# Patient Record
Sex: Female | Born: 1955 | Race: White | Hispanic: No | Marital: Married | Smoking: Former smoker
Health system: Southern US, Community
[De-identification: ages and names within clinical notes are randomized; demographics above are authoritative.]

## PROBLEM LIST (undated history)

## (undated) DIAGNOSIS — F32A Depression, unspecified: Secondary | ICD-10-CM

## (undated) DIAGNOSIS — E785 Hyperlipidemia, unspecified: Secondary | ICD-10-CM

## (undated) DIAGNOSIS — F419 Anxiety disorder, unspecified: Secondary | ICD-10-CM

## (undated) DIAGNOSIS — I10 Essential (primary) hypertension: Secondary | ICD-10-CM

## (undated) DIAGNOSIS — G4730 Sleep apnea, unspecified: Secondary | ICD-10-CM

## (undated) DIAGNOSIS — F329 Major depressive disorder, single episode, unspecified: Secondary | ICD-10-CM

## (undated) HISTORY — DX: Hyperlipidemia, unspecified: E78.5

## (undated) HISTORY — PX: BREAST EXCISIONAL BIOPSY: SUR124

## (undated) HISTORY — DX: Sleep apnea, unspecified: G47.30

---

## 1999-01-31 ENCOUNTER — Other Ambulatory Visit: Admission: RE | Admit: 1999-01-31 | Discharge: 1999-01-31 | Payer: Self-pay | Admitting: Obstetrics and Gynecology

## 1999-11-07 ENCOUNTER — Encounter: Payer: Self-pay | Admitting: Obstetrics and Gynecology

## 1999-11-07 ENCOUNTER — Encounter: Admission: RE | Admit: 1999-11-07 | Discharge: 1999-11-07 | Payer: Self-pay | Admitting: Obstetrics and Gynecology

## 2000-02-03 ENCOUNTER — Other Ambulatory Visit: Admission: RE | Admit: 2000-02-03 | Discharge: 2000-02-03 | Payer: Self-pay | Admitting: Obstetrics and Gynecology

## 2001-03-11 ENCOUNTER — Other Ambulatory Visit: Admission: RE | Admit: 2001-03-11 | Discharge: 2001-03-11 | Payer: Self-pay | Admitting: Obstetrics and Gynecology

## 2001-03-24 ENCOUNTER — Encounter: Admission: RE | Admit: 2001-03-24 | Discharge: 2001-03-24 | Payer: Self-pay | Admitting: Obstetrics and Gynecology

## 2001-03-24 ENCOUNTER — Encounter: Payer: Self-pay | Admitting: Obstetrics and Gynecology

## 2002-03-22 ENCOUNTER — Other Ambulatory Visit: Admission: RE | Admit: 2002-03-22 | Discharge: 2002-03-22 | Payer: Self-pay | Admitting: Obstetrics and Gynecology

## 2002-03-27 ENCOUNTER — Encounter: Admission: RE | Admit: 2002-03-27 | Discharge: 2002-03-27 | Payer: Self-pay | Admitting: Obstetrics and Gynecology

## 2002-03-27 ENCOUNTER — Encounter: Payer: Self-pay | Admitting: Obstetrics and Gynecology

## 2003-04-02 ENCOUNTER — Encounter: Admission: RE | Admit: 2003-04-02 | Discharge: 2003-04-02 | Payer: Self-pay | Admitting: Obstetrics and Gynecology

## 2003-04-02 ENCOUNTER — Encounter: Payer: Self-pay | Admitting: Obstetrics and Gynecology

## 2003-04-13 ENCOUNTER — Other Ambulatory Visit: Admission: RE | Admit: 2003-04-13 | Discharge: 2003-04-13 | Payer: Self-pay | Admitting: Obstetrics and Gynecology

## 2004-05-14 ENCOUNTER — Other Ambulatory Visit: Admission: RE | Admit: 2004-05-14 | Discharge: 2004-05-14 | Payer: Self-pay | Admitting: Obstetrics and Gynecology

## 2005-06-25 ENCOUNTER — Other Ambulatory Visit: Admission: RE | Admit: 2005-06-25 | Discharge: 2005-06-25 | Payer: Self-pay | Admitting: Obstetrics and Gynecology

## 2009-09-25 ENCOUNTER — Encounter: Admission: RE | Admit: 2009-09-25 | Discharge: 2009-09-25 | Payer: Self-pay | Admitting: Obstetrics and Gynecology

## 2010-11-05 ENCOUNTER — Other Ambulatory Visit: Payer: Self-pay | Admitting: Obstetrics and Gynecology

## 2010-11-05 DIAGNOSIS — Z1239 Encounter for other screening for malignant neoplasm of breast: Secondary | ICD-10-CM

## 2010-11-24 ENCOUNTER — Ambulatory Visit
Admission: RE | Admit: 2010-11-24 | Discharge: 2010-11-24 | Disposition: A | Discharge status: Home / Self Care | Payer: BC Managed Care – PPO | Source: Ambulatory Visit | Attending: Obstetrics and Gynecology | Admitting: Obstetrics and Gynecology

## 2010-11-24 DIAGNOSIS — Z1239 Encounter for other screening for malignant neoplasm of breast: Secondary | ICD-10-CM

## 2012-03-15 ENCOUNTER — Other Ambulatory Visit: Payer: Self-pay | Admitting: Obstetrics and Gynecology

## 2012-03-15 DIAGNOSIS — Z1231 Encounter for screening mammogram for malignant neoplasm of breast: Secondary | ICD-10-CM

## 2012-03-28 ENCOUNTER — Ambulatory Visit
Admission: RE | Admit: 2012-03-28 | Discharge: 2012-03-28 | Disposition: A | Discharge status: Home / Self Care | Payer: BC Managed Care – PPO | Source: Ambulatory Visit | Attending: Obstetrics and Gynecology | Admitting: Obstetrics and Gynecology

## 2012-03-28 DIAGNOSIS — Z1231 Encounter for screening mammogram for malignant neoplasm of breast: Secondary | ICD-10-CM

## 2012-07-27 ENCOUNTER — Other Ambulatory Visit: Payer: Self-pay | Admitting: Family Medicine

## 2012-07-27 DIAGNOSIS — J320 Chronic maxillary sinusitis: Secondary | ICD-10-CM

## 2012-07-28 ENCOUNTER — Ambulatory Visit
Admission: RE | Admit: 2012-07-28 | Discharge: 2012-07-28 | Disposition: A | Discharge status: Home / Self Care | Payer: BC Managed Care – PPO | Source: Ambulatory Visit | Attending: Family Medicine | Admitting: Family Medicine

## 2012-07-28 DIAGNOSIS — J320 Chronic maxillary sinusitis: Secondary | ICD-10-CM

## 2013-03-29 ENCOUNTER — Other Ambulatory Visit: Payer: Self-pay

## 2013-03-29 DIAGNOSIS — Z1231 Encounter for screening mammogram for malignant neoplasm of breast: Secondary | ICD-10-CM

## 2013-04-06 ENCOUNTER — Ambulatory Visit
Admission: RE | Admit: 2013-04-06 | Discharge: 2013-04-06 | Disposition: A | Discharge status: Home / Self Care | Payer: BC Managed Care – PPO | Source: Ambulatory Visit

## 2013-04-06 DIAGNOSIS — Z1231 Encounter for screening mammogram for malignant neoplasm of breast: Secondary | ICD-10-CM

## 2014-03-14 ENCOUNTER — Other Ambulatory Visit: Payer: Self-pay

## 2014-03-14 DIAGNOSIS — Z1231 Encounter for screening mammogram for malignant neoplasm of breast: Secondary | ICD-10-CM

## 2014-04-09 ENCOUNTER — Ambulatory Visit
Admission: RE | Admit: 2014-04-09 | Discharge: 2014-04-09 | Disposition: A | Discharge status: Home / Self Care | Payer: BC Managed Care – PPO | Source: Ambulatory Visit

## 2014-04-09 ENCOUNTER — Encounter (INDEPENDENT_AMBULATORY_CARE_PROVIDER_SITE_OTHER): Payer: Self-pay

## 2014-04-09 DIAGNOSIS — Z1231 Encounter for screening mammogram for malignant neoplasm of breast: Secondary | ICD-10-CM

## 2015-03-14 ENCOUNTER — Other Ambulatory Visit: Payer: Self-pay

## 2015-03-14 DIAGNOSIS — Z1231 Encounter for screening mammogram for malignant neoplasm of breast: Secondary | ICD-10-CM

## 2015-04-11 ENCOUNTER — Ambulatory Visit
Admission: RE | Admit: 2015-04-11 | Discharge: 2015-04-11 | Disposition: A | Discharge status: Home / Self Care | Payer: 59 | Source: Ambulatory Visit

## 2015-04-11 DIAGNOSIS — Z1231 Encounter for screening mammogram for malignant neoplasm of breast: Secondary | ICD-10-CM

## 2016-03-18 ENCOUNTER — Other Ambulatory Visit: Payer: Self-pay | Admitting: Family Medicine

## 2016-03-18 DIAGNOSIS — Z1231 Encounter for screening mammogram for malignant neoplasm of breast: Secondary | ICD-10-CM

## 2016-04-20 ENCOUNTER — Ambulatory Visit
Admission: RE | Admit: 2016-04-20 | Discharge: 2016-04-20 | Disposition: A | Discharge status: Home / Self Care | Payer: Self-pay | Source: Ambulatory Visit | Attending: Family Medicine | Admitting: Family Medicine

## 2016-04-20 DIAGNOSIS — Z1231 Encounter for screening mammogram for malignant neoplasm of breast: Secondary | ICD-10-CM

## 2016-10-05 DIAGNOSIS — J069 Acute upper respiratory infection, unspecified: Secondary | ICD-10-CM | POA: Diagnosis not present

## 2016-10-05 DIAGNOSIS — A084 Viral intestinal infection, unspecified: Secondary | ICD-10-CM | POA: Diagnosis not present

## 2017-01-05 DIAGNOSIS — Z23 Encounter for immunization: Secondary | ICD-10-CM | POA: Diagnosis not present

## 2017-01-05 DIAGNOSIS — R739 Hyperglycemia, unspecified: Secondary | ICD-10-CM | POA: Diagnosis not present

## 2017-01-05 DIAGNOSIS — G43009 Migraine without aura, not intractable, without status migrainosus: Secondary | ICD-10-CM | POA: Diagnosis not present

## 2017-01-05 DIAGNOSIS — E781 Pure hyperglyceridemia: Secondary | ICD-10-CM | POA: Diagnosis not present

## 2017-01-05 DIAGNOSIS — J309 Allergic rhinitis, unspecified: Secondary | ICD-10-CM | POA: Diagnosis not present

## 2017-02-03 DIAGNOSIS — J321 Chronic frontal sinusitis: Secondary | ICD-10-CM | POA: Diagnosis not present

## 2017-02-09 DIAGNOSIS — Z01419 Encounter for gynecological examination (general) (routine) without abnormal findings: Secondary | ICD-10-CM | POA: Diagnosis not present

## 2017-03-12 ENCOUNTER — Other Ambulatory Visit: Payer: Self-pay | Admitting: Obstetrics and Gynecology

## 2017-03-12 DIAGNOSIS — Z1231 Encounter for screening mammogram for malignant neoplasm of breast: Secondary | ICD-10-CM

## 2017-03-12 DIAGNOSIS — L281 Prurigo nodularis: Secondary | ICD-10-CM | POA: Diagnosis not present

## 2017-03-12 DIAGNOSIS — L821 Other seborrheic keratosis: Secondary | ICD-10-CM | POA: Diagnosis not present

## 2017-03-12 DIAGNOSIS — L738 Other specified follicular disorders: Secondary | ICD-10-CM | POA: Diagnosis not present

## 2017-03-12 DIAGNOSIS — L570 Actinic keratosis: Secondary | ICD-10-CM | POA: Diagnosis not present

## 2017-03-15 DIAGNOSIS — Z13820 Encounter for screening for osteoporosis: Secondary | ICD-10-CM | POA: Diagnosis not present

## 2017-04-21 ENCOUNTER — Ambulatory Visit: Payer: Self-pay

## 2017-04-21 ENCOUNTER — Ambulatory Visit
Admission: RE | Admit: 2017-04-21 | Discharge: 2017-04-21 | Disposition: A | Discharge status: Home / Self Care | Payer: Commercial Managed Care - PPO | Source: Ambulatory Visit | Attending: Obstetrics and Gynecology | Admitting: Obstetrics and Gynecology

## 2017-04-21 DIAGNOSIS — Z1231 Encounter for screening mammogram for malignant neoplasm of breast: Secondary | ICD-10-CM | POA: Diagnosis not present

## 2017-05-14 DIAGNOSIS — Z23 Encounter for immunization: Secondary | ICD-10-CM | POA: Diagnosis not present

## 2017-07-12 DIAGNOSIS — R350 Frequency of micturition: Secondary | ICD-10-CM | POA: Diagnosis not present

## 2017-08-02 DIAGNOSIS — Z23 Encounter for immunization: Secondary | ICD-10-CM | POA: Diagnosis not present

## 2017-08-02 DIAGNOSIS — Z713 Dietary counseling and surveillance: Secondary | ICD-10-CM | POA: Diagnosis not present

## 2017-10-08 DIAGNOSIS — M79672 Pain in left foot: Secondary | ICD-10-CM | POA: Diagnosis not present

## 2017-10-08 DIAGNOSIS — M21612 Bunion of left foot: Secondary | ICD-10-CM | POA: Diagnosis not present

## 2017-10-08 DIAGNOSIS — M2042 Other hammer toe(s) (acquired), left foot: Secondary | ICD-10-CM | POA: Diagnosis not present

## 2017-12-22 DIAGNOSIS — M21612 Bunion of left foot: Secondary | ICD-10-CM | POA: Diagnosis not present

## 2017-12-22 DIAGNOSIS — M2042 Other hammer toe(s) (acquired), left foot: Secondary | ICD-10-CM | POA: Diagnosis not present

## 2017-12-22 DIAGNOSIS — M21619 Bunion of unspecified foot: Secondary | ICD-10-CM | POA: Diagnosis not present

## 2018-01-17 DIAGNOSIS — G43009 Migraine without aura, not intractable, without status migrainosus: Secondary | ICD-10-CM | POA: Diagnosis not present

## 2018-01-18 DIAGNOSIS — Z0189 Encounter for other specified special examinations: Secondary | ICD-10-CM | POA: Diagnosis not present

## 2018-01-18 DIAGNOSIS — Z1159 Encounter for screening for other viral diseases: Secondary | ICD-10-CM | POA: Diagnosis not present

## 2018-01-26 ENCOUNTER — Other Ambulatory Visit: Payer: Self-pay | Admitting: Orthopedic Surgery

## 2018-02-17 DIAGNOSIS — Z01419 Encounter for gynecological examination (general) (routine) without abnormal findings: Secondary | ICD-10-CM | POA: Diagnosis not present

## 2018-02-17 DIAGNOSIS — Z1212 Encounter for screening for malignant neoplasm of rectum: Secondary | ICD-10-CM | POA: Diagnosis not present

## 2018-02-17 DIAGNOSIS — Z6826 Body mass index (BMI) 26.0-26.9, adult: Secondary | ICD-10-CM | POA: Diagnosis not present

## 2018-03-08 ENCOUNTER — Other Ambulatory Visit: Payer: Self-pay

## 2018-03-08 ENCOUNTER — Encounter (HOSPITAL_BASED_OUTPATIENT_CLINIC_OR_DEPARTMENT_OTHER): Payer: Self-pay | Admitting: *Deleted

## 2018-03-11 ENCOUNTER — Other Ambulatory Visit: Payer: Self-pay | Admitting: Family Medicine

## 2018-03-11 DIAGNOSIS — Z1231 Encounter for screening mammogram for malignant neoplasm of breast: Secondary | ICD-10-CM

## 2018-03-17 ENCOUNTER — Other Ambulatory Visit: Payer: Self-pay

## 2018-03-17 ENCOUNTER — Ambulatory Visit (HOSPITAL_BASED_OUTPATIENT_CLINIC_OR_DEPARTMENT_OTHER): Payer: Commercial Managed Care - PPO | Admitting: Certified Registered"

## 2018-03-17 ENCOUNTER — Encounter (HOSPITAL_BASED_OUTPATIENT_CLINIC_OR_DEPARTMENT_OTHER)
Admission: RE | Disposition: A | Discharge status: Home / Self Care | Payer: Self-pay | Source: Ambulatory Visit | Attending: Orthopedic Surgery

## 2018-03-17 ENCOUNTER — Ambulatory Visit (HOSPITAL_BASED_OUTPATIENT_CLINIC_OR_DEPARTMENT_OTHER)
Admission: RE | Admit: 2018-03-17 | Discharge: 2018-03-17 | Disposition: A | Discharge status: Home / Self Care | Payer: Commercial Managed Care - PPO | Source: Ambulatory Visit | Attending: Orthopedic Surgery | Admitting: Orthopedic Surgery

## 2018-03-17 ENCOUNTER — Encounter (HOSPITAL_BASED_OUTPATIENT_CLINIC_OR_DEPARTMENT_OTHER): Payer: Self-pay | Admitting: Certified Registered"

## 2018-03-17 DIAGNOSIS — F329 Major depressive disorder, single episode, unspecified: Secondary | ICD-10-CM | POA: Insufficient documentation

## 2018-03-17 DIAGNOSIS — Z79899 Other long term (current) drug therapy: Secondary | ICD-10-CM | POA: Diagnosis not present

## 2018-03-17 DIAGNOSIS — Z91040 Latex allergy status: Secondary | ICD-10-CM | POA: Insufficient documentation

## 2018-03-17 DIAGNOSIS — I10 Essential (primary) hypertension: Secondary | ICD-10-CM | POA: Diagnosis not present

## 2018-03-17 DIAGNOSIS — Z79890 Hormone replacement therapy (postmenopausal): Secondary | ICD-10-CM | POA: Insufficient documentation

## 2018-03-17 DIAGNOSIS — M21612 Bunion of left foot: Secondary | ICD-10-CM | POA: Insufficient documentation

## 2018-03-17 DIAGNOSIS — F419 Anxiety disorder, unspecified: Secondary | ICD-10-CM | POA: Insufficient documentation

## 2018-03-17 DIAGNOSIS — M2042 Other hammer toe(s) (acquired), left foot: Secondary | ICD-10-CM | POA: Diagnosis not present

## 2018-03-17 DIAGNOSIS — G8918 Other acute postprocedural pain: Secondary | ICD-10-CM | POA: Diagnosis not present

## 2018-03-17 HISTORY — DX: Anxiety disorder, unspecified: F41.9

## 2018-03-17 HISTORY — DX: Depression, unspecified: F32.A

## 2018-03-17 HISTORY — DX: Major depressive disorder, single episode, unspecified: F32.9

## 2018-03-17 HISTORY — DX: Essential (primary) hypertension: I10

## 2018-03-17 HISTORY — PX: BUNIONECTOMY WITH HAMMERTOE RECONSTRUCTION: SHX5600

## 2018-03-17 SURGERY — BUNIONECTOMY, WITH HAMMER TOE CORRECTION
Anesthesia: General | Site: Foot | Laterality: Left

## 2018-03-17 MED ORDER — SENNA 8.6 MG PO TABS: 2.0000 | ORAL_TABLET | Freq: Two times a day (BID) | ORAL | 0 refills | Status: DC

## 2018-03-17 MED ORDER — MIDAZOLAM HCL 2 MG/2ML IJ SOLN
1.0000 mg | INTRAMUSCULAR | Status: DC | PRN
  Administered 2018-03-17: 2 mg via INTRAVENOUS

## 2018-03-17 MED ORDER — ONDANSETRON HCL 4 MG/2ML IJ SOLN
INTRAMUSCULAR | Status: AC
  Filled 2018-03-17: qty 12

## 2018-03-17 MED ORDER — PROPOFOL 10 MG/ML IV BOLUS
INTRAVENOUS | Status: DC | PRN
  Administered 2018-03-17: 150 mg via INTRAVENOUS

## 2018-03-17 MED ORDER — SCOPOLAMINE 1 MG/3DAYS TD PT72: 1.0000 | MEDICATED_PATCH | Freq: Once | TRANSDERMAL | Status: DC | PRN

## 2018-03-17 MED ORDER — BUPIVACAINE-EPINEPHRINE (PF) 0.5% -1:200000 IJ SOLN
INTRAMUSCULAR | Status: AC
  Filled 2018-03-17: qty 30

## 2018-03-17 MED ORDER — FENTANYL CITRATE (PF) 100 MCG/2ML IJ SOLN: 25.0000 ug | INTRAMUSCULAR | Status: DC | PRN

## 2018-03-17 MED ORDER — SCOPOLAMINE 1 MG/3DAYS TD PT72: 1.0000 | MEDICATED_PATCH | TRANSDERMAL | Status: DC

## 2018-03-17 MED ORDER — ONDANSETRON HCL 4 MG/2ML IJ SOLN
INTRAMUSCULAR | Status: DC | PRN
  Administered 2018-03-17: 4 mg via INTRAVENOUS

## 2018-03-17 MED ORDER — FENTANYL CITRATE (PF) 100 MCG/2ML IJ SOLN
50.0000 ug | INTRAMUSCULAR | Status: DC | PRN
  Administered 2018-03-17: 100 ug via INTRAVENOUS

## 2018-03-17 MED ORDER — LACTATED RINGERS IV SOLN
INTRAVENOUS | Status: DC
  Administered 2018-03-17 (×2): via INTRAVENOUS

## 2018-03-17 MED ORDER — LIDOCAINE HCL (CARDIAC) PF 100 MG/5ML IV SOSY
PREFILLED_SYRINGE | INTRAVENOUS | Status: AC
  Filled 2018-03-17: qty 15

## 2018-03-17 MED ORDER — SODIUM CHLORIDE 0.9 % IV SOLN: INTRAVENOUS | Status: DC

## 2018-03-17 MED ORDER — OXYCODONE HCL 5 MG PO TABS: 5.0000 mg | ORAL_TABLET | ORAL | 0 refills | Status: AC | PRN

## 2018-03-17 MED ORDER — DEXAMETHASONE SODIUM PHOSPHATE 10 MG/ML IJ SOLN
INTRAMUSCULAR | Status: AC
Start: 2018-03-17 — End: ?
  Filled 2018-03-17: qty 3

## 2018-03-17 MED ORDER — MIDAZOLAM HCL 2 MG/2ML IJ SOLN
INTRAMUSCULAR | Status: AC
  Filled 2018-03-17: qty 2

## 2018-03-17 MED ORDER — DEXAMETHASONE SODIUM PHOSPHATE 10 MG/ML IJ SOLN
INTRAMUSCULAR | Status: DC | PRN
  Administered 2018-03-17: 10 mg via INTRAVENOUS

## 2018-03-17 MED ORDER — CHLORHEXIDINE GLUCONATE 4 % EX LIQD: 60.0000 mL | Freq: Once | CUTANEOUS | Status: DC

## 2018-03-17 MED ORDER — PROPOFOL 10 MG/ML IV BOLUS
INTRAVENOUS | Status: AC
  Filled 2018-03-17: qty 20

## 2018-03-17 MED ORDER — BUPIVACAINE-EPINEPHRINE (PF) 0.5% -1:200000 IJ SOLN
INTRAMUSCULAR | Status: DC | PRN
  Administered 2018-03-17: 30 mL via PERINEURAL

## 2018-03-17 MED ORDER — 0.9 % SODIUM CHLORIDE (POUR BTL) OPTIME
TOPICAL | Status: DC | PRN
  Administered 2018-03-17: 180 mL

## 2018-03-17 MED ORDER — CEFAZOLIN SODIUM-DEXTROSE 2-4 GM/100ML-% IV SOLN
INTRAVENOUS | Status: AC
  Filled 2018-03-17: qty 100

## 2018-03-17 MED ORDER — CEFAZOLIN SODIUM-DEXTROSE 2-4 GM/100ML-% IV SOLN
2.0000 g | INTRAVENOUS | Status: AC
  Administered 2018-03-17: 2 g via INTRAVENOUS

## 2018-03-17 MED ORDER — PROMETHAZINE HCL 25 MG/ML IJ SOLN: 6.2500 mg | INTRAMUSCULAR | Status: DC | PRN

## 2018-03-17 MED ORDER — FENTANYL CITRATE (PF) 100 MCG/2ML IJ SOLN
INTRAMUSCULAR | Status: AC
  Filled 2018-03-17: qty 2

## 2018-03-17 MED ORDER — DOCUSATE SODIUM 100 MG PO CAPS: 100.0000 mg | ORAL_CAPSULE | Freq: Two times a day (BID) | ORAL | 0 refills | Status: DC

## 2018-03-17 SURGICAL SUPPLY — 72 items
BANDAGE ESMARK 6X9 LF (GAUZE/BANDAGES/DRESSINGS) IMPLANT
BIT DRILL 1.5X30 QC DISP (BIT) ×3 IMPLANT
BIT DRILL 1.8 CANN MAX VPC (BIT) ×3 IMPLANT
BLADE AVERAGE 25X9 (BLADE) ×3 IMPLANT
BLADE LONG MED 25X9 (BLADE) IMPLANT
BLADE MICRO SAGITTAL (BLADE) IMPLANT
BLADE OSC/SAG .038X5.5 CUT EDG (BLADE) ×3 IMPLANT
BLADE SURG 15 STRL LF DISP TIS (BLADE) ×6 IMPLANT
BLADE SURG 15 STRL SS (BLADE) ×3
BNDG COHESIVE 4X5 TAN STRL (GAUZE/BANDAGES/DRESSINGS) ×3 IMPLANT
BNDG COHESIVE 6X5 TAN STRL LF (GAUZE/BANDAGES/DRESSINGS) IMPLANT
BNDG CONFORM 3 STRL LF (GAUZE/BANDAGES/DRESSINGS) ×3 IMPLANT
BNDG ESMARK 4X9 LF (GAUZE/BANDAGES/DRESSINGS) IMPLANT
BNDG ESMARK 6X9 LF (GAUZE/BANDAGES/DRESSINGS)
CHLORAPREP W/TINT 26ML (MISCELLANEOUS) ×3 IMPLANT
COVER BACK TABLE 60X90IN (DRAPES) ×3 IMPLANT
CUFF TOURNIQUET SINGLE 24IN (TOURNIQUET CUFF) IMPLANT
CUFF TOURNIQUET SINGLE 34IN LL (TOURNIQUET CUFF) ×3 IMPLANT
DRAPE EXTREMITY T 121X128X90 (DRAPE) ×3 IMPLANT
DRAPE OEC MINIVIEW 54X84 (DRAPES) ×3 IMPLANT
DRAPE U-SHAPE 47X51 STRL (DRAPES) ×3 IMPLANT
DRSG MEPITEL 4X7.2 (GAUZE/BANDAGES/DRESSINGS) ×3 IMPLANT
DRSG PAD ABDOMINAL 8X10 ST (GAUZE/BANDAGES/DRESSINGS) ×3 IMPLANT
ELECT REM PT RETURN 9FT ADLT (ELECTROSURGICAL) ×3
ELECTRODE REM PT RTRN 9FT ADLT (ELECTROSURGICAL) ×2 IMPLANT
GAUZE SPONGE 4X4 12PLY STRL (GAUZE/BANDAGES/DRESSINGS) ×3 IMPLANT
GLOVE BIOGEL PI IND STRL 7.0 (GLOVE) ×2 IMPLANT
GLOVE BIOGEL PI IND STRL 8 (GLOVE) ×4 IMPLANT
GLOVE BIOGEL PI INDICATOR 7.0 (GLOVE) ×1
GLOVE BIOGEL PI INDICATOR 8 (GLOVE) ×2
GLOVE SURG SS PI 7.0 STRL IVOR (GLOVE) ×3 IMPLANT
GLOVE SURG SS PI 8.0 STRL IVOR (GLOVE) ×6 IMPLANT
GOWN STRL REUS W/ TWL LRG LVL3 (GOWN DISPOSABLE) ×2 IMPLANT
GOWN STRL REUS W/ TWL XL LVL3 (GOWN DISPOSABLE) ×4 IMPLANT
GOWN STRL REUS W/TWL LRG LVL3 (GOWN DISPOSABLE) ×1
GOWN STRL REUS W/TWL XL LVL3 (GOWN DISPOSABLE) ×2
K-WIRE .054X4 (WIRE) ×3 IMPLANT
K-WIRE COCR 0.9X95 (WIRE) ×3
KWIRE COCR 0.9X95 (WIRE) ×2 IMPLANT
NEEDLE HYPO 22GX1.5 SAFETY (NEEDLE) IMPLANT
NEEDLE HYPO 25X1 1.5 SAFETY (NEEDLE) IMPLANT
NS IRRIG 1000ML POUR BTL (IV SOLUTION) ×3 IMPLANT
PACK BASIN DAY SURGERY FS (CUSTOM PROCEDURE TRAY) ×3 IMPLANT
PAD CAST 4YDX4 CTTN HI CHSV (CAST SUPPLIES) ×2 IMPLANT
PADDING CAST COTTON 4X4 STRL (CAST SUPPLIES) ×1
PADDING CAST COTTON 6X4 STRL (CAST SUPPLIES) IMPLANT
PENCIL BUTTON HOLSTER BLD 10FT (ELECTRODE) ×3 IMPLANT
SANITIZER HAND PURELL 535ML FO (MISCELLANEOUS) ×3 IMPLANT
SCREW CORTICAL 2.0X18MM (Screw) ×6 IMPLANT
SCREW HCS TWIST-OFF 2.0X14MM (Screw) ×3 IMPLANT
SCREW MAX VPC  2.5X20 (Screw) ×1 IMPLANT
SCREW MAX VPC 2.5X20 (Screw) ×2 IMPLANT
SHEET MEDIUM DRAPE 40X70 STRL (DRAPES) ×3 IMPLANT
SLEEVE SCD COMPRESS KNEE MED (MISCELLANEOUS) ×3 IMPLANT
SPLINT FAST PLASTER 5X30 (CAST SUPPLIES)
SPLINT PLASTER CAST FAST 5X30 (CAST SUPPLIES) IMPLANT
SPONGE LAP 18X18 RF (DISPOSABLE) ×3 IMPLANT
STOCKINETTE 6  STRL (DRAPES) ×1
STOCKINETTE 6 STRL (DRAPES) ×2 IMPLANT
SUCTION FRAZIER HANDLE 10FR (MISCELLANEOUS) ×1
SUCTION TUBE FRAZIER 10FR DISP (MISCELLANEOUS) ×2 IMPLANT
SUT ETHILON 3 0 PS 1 (SUTURE) ×6 IMPLANT
SUT MNCRL AB 3-0 PS2 18 (SUTURE) ×3 IMPLANT
SUT VIC AB 0 SH 27 (SUTURE) IMPLANT
SUT VIC AB 2-0 SH 27 (SUTURE) ×2
SUT VIC AB 2-0 SH 27XBRD (SUTURE) ×2 IMPLANT
SUT VICRYL 0 UR6 27IN ABS (SUTURE) IMPLANT
SYR BULB 3OZ (MISCELLANEOUS) ×3 IMPLANT
SYR CONTROL 10ML LL (SYRINGE) IMPLANT
TOWEL GREEN STERILE FF (TOWEL DISPOSABLE) ×6 IMPLANT
TUBE CONNECTING 20X1/4 (TUBING) ×3 IMPLANT
UNDERPAD 30X30 (UNDERPADS AND DIAPERS) ×3 IMPLANT

## 2018-03-17 NOTE — Anesthesia Procedure Notes (Signed)
Anesthesia Regional Block: Popliteal block   Pre-Anesthetic Checklist: ,, timeout performed, Correct Patient, Correct Site, Correct Laterality, Correct Procedure, Correct Position, site marked, Risks and benefits discussed,  Surgical consent,  Pre-op evaluation,  At surgeon's request and post-op pain management  Laterality: Left  Prep: chloraprep       Needles:  Injection technique: Single-shot  Needle Type: Echogenic Stimulator Needle          Additional Needles:   Narrative:  Start time: 03/17/2018 7:01 AM End time: 03/17/2018 7:11 AM Injection made incrementally with aspirations every 5 mL.  Performed by: Personally  Anesthesiologist: Duane Boston, MD  Additional Notes: A functioning IV was confirmed and monitors were applied.  Sterile prep and drape, hand hygiene and sterile gloves were used.  Negative aspiration and test dose prior to incremental administration of local anesthetic. The patient tolerated the procedure well.Ultrasound  guidance: relevant anatomy identified, needle position confirmed, local anesthetic spread visualized around nerve(s), vascular puncture avoided.  Image printed for medical record.

## 2018-03-17 NOTE — Anesthesia Postprocedure Evaluation (Signed)
Anesthesia Post Note  Patient: Melinda Hodge  Procedure(s) Performed: Left First Metatarsal Scarf Osteotomy and Modified McBride Buionectomy; Left Second Metatarsal Weil Osteotomy and Hammertoe Correction (Left Foot)     Patient location during evaluation: PACU Anesthesia Type: General Level of consciousness: sedated Pain management: pain level controlled Vital Signs Assessment: post-procedure vital signs reviewed and stable Respiratory status: spontaneous breathing and respiratory function stable Cardiovascular status: stable Postop Assessment: no apparent nausea or vomiting Anesthetic complications: no    Last Vitals:  Vitals:   03/17/18 0915 03/17/18 0917  BP: 135/72   Pulse: 86 82  Resp: 15 14  Temp:    SpO2: 98% 100%    Last Pain:  Vitals:   03/17/18 0642  TempSrc: Oral                 [name]

## 2018-03-17 NOTE — H&P (Signed)
Melinda Hodge is an 62 y.o. female.   Chief Complaint: left foot pain HPI: The patient is a 62 year old female without significant past medical history.  She has a painful left bunion deformity as well as a second hammertoe deformity.  She has failed nonoperative treatment including activity modification, oral anti-inflammatories and shoe wear modification.  She presents today for surgical treatment of this painful condition.  Past Medical History:  Diagnosis Date  . Anxiety   . Depression   . Hypertension    controlled by diet and exercise    Past Surgical History:  Procedure Laterality Date  . CESAREAN SECTION     times three    History reviewed. No pertinent family history. Social History:  reports that she has never smoked. She has never used smokeless tobacco. She reports that she drinks alcohol. She reports that she does not use drugs.  Allergies:  Allergies  Allergen Reactions  . Latex Itching and Rash    Medications Prior to Admission  Medication Sig Dispense Refill  . ALPRAZolam (XANAX) 0.25 MG tablet Take 0.25 mg by mouth 2 (two) times daily as needed for anxiety.    . cetirizine (ZYRTEC) 10 MG tablet Take 10 mg by mouth daily.    . cholecalciferol (VITAMIN D) 1000 units tablet Take 2,000 Units by mouth daily.    Marland Kitchen estradiol (VIVELLE-DOT) 0.025 MG/24HR Place 1 patch onto the skin 2 (two) times a week.    . famotidine (PEPCID) 20 MG tablet Take 20 mg by mouth 2 (two) times daily.    . progesterone (PROMETRIUM) 100 MG capsule Take 100 mg by mouth daily.    . valACYclovir (VALTREX) 500 MG tablet Take 400 mg by mouth 2 (two) times daily.    Marland Kitchen venlafaxine XR (EFFEXOR-XR) 75 MG 24 hr capsule Take 75 mg by mouth daily with breakfast.    . SUMAtriptan (IMITREX) 100 MG tablet Take 100 mg by mouth every 2 (two) hours as needed for migraine. May repeat in 2 hours if headache persists or recurs.      No results found for this or any previous visit (from the past 48 hour(s)). No  results found.  ROS no recent fever, chills, nausea, vomiting or changes in her appetite  Blood pressure 138/74, pulse 78, temperature 98.4 F (36.9 C), temperature source Oral, resp. rate 18, height 5' 5.5" (1.664 m), weight 71.2 kg (157 lb), SpO2 99 %. Physical Exam  Well-nourished well-developed woman in no apparent distress.  Alert and oriented x4.  Mood and affect are normal.  Extraocular motions are intact.  Respirations are unlabored.  Gait is normal.  Left foot has a moderate bunion deformity with a second hammertoe deformity.  Skin is healthy and intact.  No lymphadenopathy.  Pulses are palpable.  5 out of 5 strength in plantar flexion and dorsiflexion of the ankle and toes.  Assessment/Plan Left foot bunion and second hammertoe deformities -to the operating room today for left first metatarsal scarf osteotomy, modified McBride bunionectomy and second metatarsal Weil osteotomy with second hammertoe correction.  The risks and benefits of the alternative treatment options have been discussed in detail.  The patient wishes to proceed with surgery and specifically understands risks of bleeding, infection, nerve damage, blood clots, need for additional surgery, amputation and death.   Wylene Simmer, MD 04-13-2018, 7:16 AM

## 2018-03-17 NOTE — Transfer of Care (Signed)
Immediate Anesthesia Transfer of Care Note  Patient: Melinda Hodge  Procedure(s) Performed: Left First Metatarsal Scarf Osteotomy and Modified McBride Buionectomy; Left Second Metatarsal Weil Osteotomy and Hammertoe Correction (Left Foot)  Patient Location: PACU  Anesthesia Type:GA combined with regional for post-op pain  Level of Consciousness: awake and patient cooperative  Airway & Oxygen Therapy: Patient Spontanous Breathing and Patient connected to face mask oxygen  Post-op Assessment: Report given to RN and Post -op Vital signs reviewed and stable  Post vital signs: Reviewed and stable  Last Vitals:  Vitals Value Taken Time  BP    Temp    Pulse 82 03/17/2018  8:38 AM  Resp    SpO2 100 % 03/17/2018  8:38 AM  Vitals shown include unvalidated device data.  Last Pain:  Vitals:   03/17/18 0642  TempSrc: Oral         Complications: No apparent anesthesia complications

## 2018-03-17 NOTE — Discharge Instructions (Addendum)
Melinda Simmer, MD [place]  Please read the following information regarding your care after surgery.  Medications  You only need a prescription for the narcotic pain medicine (ex. oxycodone, Percocet, Norco).  All of the other medicines listed below are available over the counter. X Aleve 2 pills twice a day for the first 3 days after surgery. X acetominophen (Tylenol) 650 mg every 4-6 hours as you need for minor to moderate pain X oxycodone as prescribed for severe pain  Narcotic pain medicine (ex. oxycodone, Percocet, Vicodin) will cause constipation.  To prevent this problem, take the following medicines while you are taking any pain medicine. X docusate sodium (Colace) 100 mg twice a day X senna (Senokot) 2 tablets twice a day  Weight Bearing X Bear weight only on your operated foot in the darco wedge post-op shoe.   Cast / Splint / Dressing X Keep your splint, cast or dressing clean and dry.  Dont put anything (coat hanger, pencil, etc) down inside of it.  If it gets damp, use a hair dryer on the cool setting to dry it.  If it gets soaked, call the office to schedule an appointment for a cast change.  After your dressing, cast or splint is removed; you may shower, but do not soak or scrub the wound.  Allow the water to run over it, and then gently pat it dry.  Swelling It is normal for you to have swelling where you had surgery.  To reduce swelling and pain, keep your toes above your nose for at least 3 days after surgery.  It may be necessary to keep your foot or leg elevated for several weeks.  If it hurts, it should be elevated.  Follow Up Call my office at [phone] when you are discharged from the hospital or surgery center to schedule an appointment to be seen two weeks after surgery.  Call my office at [phone] if you develop a fever >101.5 F, nausea, vomiting, bleeding from the surgical site or severe pain.    Regional Anesthesia Blocks  1. Numbness  or the inability to move the "blocked" extremity may last from 3-48 hours after placement. The length of time depends on the medication injected and your individual response to the medication. If the numbness is not going away after 48 hours, call your surgeon.  2. The extremity that is blocked will need to be protected until the numbness is gone and the  Strength has returned. Because you cannot feel it, you will need to take extra care to avoid injury. Because it may be weak, you may have difficulty moving it or using it. You may not know what position it is in without looking at it while the block is in effect.  3. For blocks in the legs and feet, returning to weight bearing and walking needs to be done carefully. You will need to wait until the numbness is entirely gone and the strength has returned. You should be able to move your leg and foot normally before you try and bear weight or walk. You will need someone to be with you when you first try to ensure you do not fall and possibly risk injury.  4. Bruising and tenderness at the needle site are common side effects and will resolve in a few days.  5. Persistent numbness or new problems with movement should be communicated to the surgeon or the [facility] [phone] [place] [phone]).  [facility]  Care Instructions  Activity: Get plenty of rest for the remainder of the day. A responsible individual must stay with you for 24 hours following the procedure.  For the next 24 hours, DO NOT: -Drive a car -Paediatric nurse -Drink alcoholic beverages -Take any medication unless instructed by your physician -Make any legal decisions or sign important papers.  Meals: Start with liquid foods such as gelatin or soup. Progress to regular foods as tolerated. Avoid greasy, spicy, heavy foods. If nausea and/or vomiting occur, drink only clear liquids until the nausea and/or vomiting subsides. Call your  physician if vomiting continues.  Special Instructions/Symptoms: Your throat may feel dry or sore from the anesthesia or the breathing tube placed in your throat during surgery. If this causes discomfort, gargle with warm salt water. The discomfort should disappear within 24 hours.  If you had a scopolamine patch placed behind your ear for the management of post- operative nausea and/or vomiting:  1. The medication in the patch is effective for 72 hours, after which it should be removed.  Wrap patch in a tissue and discard in the trash. Wash hands thoroughly with soap and water. 2. You may remove the patch earlier than 72 hours if you experience unpleasant side effects which may include dry mouth, dizziness or visual disturbances. 3. Avoid touching the patch. Wash your hands with soap and water after contact with the patch.

## 2018-03-17 NOTE — Op Note (Signed)
03/17/2018  8:35 AM  PATIENT:  Melinda Hodge  62 y.o. female  PRE-OPERATIVE DIAGNOSIS:  Left bunion and Second hammertoe  POST-OPERATIVE DIAGNOSIS:  Left bunion and Second hammertoe  Procedure(s): 1.  Left First Metatarsal Scarf Osteotomy and Modified McBride Buionectomy;  2.  Left Second Metatarsal Weil Osteotomy  (separate incision) 3.  Left 2nd Hammertoe Correction (separate incision) 4.  AP and lateral xrays of the left foot  SURGEON:  Wylene Simmer, MD  ASSISTANT: Mechele Claude, PA-C  ANESTHESIA:   General, regional  EBL:  minimal   TOURNIQUET:  < 1 hour at 983 mm Hg  COMPLICATIONS:  None apparent  DISPOSITION:  Extubated, awake and stable to recovery.  INDICATION FOR PROCEDURE: The patient is a 62 year old female with left forefoot pain due to a bunion and second hammertoe deformity.  She has failed nonoperative treatment to date including activity modification, oral anti-inflammatories and shoewear modification.  She presents today for surgical treatment of these painful forefoot deformities.  The risks and benefits of the alternative treatment options have been discussed in detail.  The patient wishes to proceed with surgery and specifically understands risks of bleeding, infection, nerve damage, blood clots, need for additional surgery, amputation and death.  PROCEDURE IN DETAIL:  After pre operative consent was obtained, and the correct operative site was identified, the patient was brought to the operating room and placed supine on the OR table.  Anesthesia was administered.  Pre-operative antibiotics were administered.  A surgical timeout was taken.  The left lower extremity was prepped and draped in standard sterile fashion with a tourniquet around the thigh.  The extremity was elevated and the tourniquet was inflated to 250 mmHg.  An incision was then made at the dorsal aspect of the first webspace.  Dissection was carried down through the subcutaneous tissues.  The  intermetatarsal ligament was divided under direct vision.  An arthrotomy was then made between the lateral sesamoid and the metatarsal head.  Small perforations were then made in the lateral joint capsule.  The hallux could then be positioned in 20 degrees of varus passively.  Attention was turned to the medial forefoot where a longitudinal incision was made over the medial eminence.  Dissection was carried down through the subcutaneous tissues.  The medial joint capsule was incised and elevated plantarly and dorsally.  The hypertrophic medial eminence was resected in line with the first metatarsal shaft.  A Weil osteotomy was then made in the first metatarsal.  The osteotomy was mobilized and the head was translated laterally to correct the intermetatarsal and hallux valgus angles.  AP and lateral radiographs confirmed appropriate correction of the bunion deformity.  The osteotomy was then fixed with 2 Biomet 2 mm screws.  Overhanging bone was trimmed with the oscillating saw.  Attention was turned to the webspace where the second MTP joint was opened in the dorsal joint capsule excised.  The extensor tendons were lengthened.  A Weil osteotomy was made in the second metatarsal head removing a small wedge of bone.  The head was allowed to translate proximally several millimeters and was fixed with a 2 mm Biomet FRS screw.  AP and lateral radiographs confirmed appropriate shortening of the second metatarsal.  Attention was turned to the second toe where a transverse incision was made over the PIP joint.  Dissection was carried down through the sub-cutaneous tissues and extensor mechanism.  The head of the proximal phalanx was resected with the oscillating saw followed by the base of  the middle phalanx.  The joint was then reduced and fixed with a 2.5 mm Biomet VPC screw.  Final AP and lateral radiographs showed appropriate position and length of all hardware and appropriate correction of the hammertoe and  bunion deformities.  The wounds were then irrigated copiously.  The extensor tendons were repaired.  The medial joint capsule was repaired with number caring sutures of 2-0 Vicryl.  Subtenons tissues were approximated with Monocryl and skin incisions were closed with nylon.  Sterile dressings were applied followed by a bunion wrap.  The tourniquet was released after application of the dressings.  The patient was awakened from anesthesia and transported to the recovery room in stable condition.   FOLLOW UP PLAN: Weightbearing as tolerated on the heel in a Darco wedge shoe.  Follow-up in the office in 2 weeks for suture removal and toe spacer.   RADIOGRAPHS: AP and lateral radiographs of the left foot were obtained intraoperatively.  These show interval correction of hallux valgus and second hammertoe deformities.  Hardware is appropriately positioned and of the appropriate lengths.  No other acute injuries are noted.    Mechele Claude PA-C was present and scrubbed for the duration of the operative case. His assistance was essential in positioning the patient, prepping and draping, gaining and maintaining exposure, performing the operation, closing and dressing the wounds and applying the splint.

## 2018-03-17 NOTE — Anesthesia Preprocedure Evaluation (Signed)
Anesthesia Evaluation  Patient identified by MRN, date of birth, ID band Patient awake    Reviewed: Allergy & Precautions, NPO status , Patient's Chart, lab work & pertinent test results  Airway Mallampati: II  TM Distance: >3 FB Neck ROM: Full    Dental no notable dental hx. (+) Dental Advisory Given   Pulmonary neg pulmonary ROS,    Pulmonary exam normal        Cardiovascular hypertension, Normal cardiovascular exam     Neuro/Psych PSYCHIATRIC DISORDERS Anxiety Depression negative neurological ROS     GI/Hepatic negative GI ROS, Neg liver ROS,   Endo/Other  negative endocrine ROS  Renal/GU negative Renal ROS  negative genitourinary   Musculoskeletal negative musculoskeletal ROS (+)   Abdominal   Peds negative pediatric ROS (+)  Hematology negative hematology ROS (+)   Anesthesia Other Findings   Reproductive/Obstetrics negative OB ROS                             Anesthesia Physical Anesthesia Plan  ASA: II  Anesthesia Plan: General   Post-op Pain Management:  Regional for Post-op pain   Induction: Intravenous  PONV Risk Score and Plan: 3 and Ondansetron, Dexamethasone and Scopolamine patch - Pre-op  Airway Management Planned: LMA  Additional Equipment:   Intra-op Plan:   Post-operative Plan: Extubation in OR  Informed Consent: I have reviewed the patients History and Physical, chart, labs and discussed the procedure including the risks, benefits and alternatives for the proposed anesthesia with the patient or authorized representative who has indicated his/her understanding and acceptance.   Dental advisory given  Plan Discussed with: CRNA and Anesthesiologist  Anesthesia Plan Comments:         Anesthesia Quick Evaluation

## 2018-03-17 NOTE — Progress Notes (Signed)
Assisted Dr. Singer with left, ultrasound guided, popliteal block. Side rails up, monitors on throughout procedure. See vital signs in flow sheet. Tolerated Procedure well. 

## 2018-03-17 NOTE — Anesthesia Procedure Notes (Signed)
Procedure Name: LMA Insertion Date/Time: 03/17/2018 7:37 AM Performed by: Signe Colt, CRNA Pre-anesthesia Checklist: Patient identified, Emergency Drugs available, Suction available and Patient being monitored Patient Re-evaluated:Patient Re-evaluated prior to induction Oxygen Delivery Method: Circle system utilized Preoxygenation: Pre-oxygenation with 100% oxygen Induction Type: IV induction Ventilation: Mask ventilation without difficulty LMA: LMA inserted LMA Size: 4.0 Number of attempts: 1 Airway Equipment and Method: Bite block Placement Confirmation: positive ETCO2 Tube secured with: Tape Dental Injury: Teeth and Oropharynx as per pre-operative assessment

## 2018-03-18 ENCOUNTER — Encounter (HOSPITAL_BASED_OUTPATIENT_CLINIC_OR_DEPARTMENT_OTHER): Payer: Self-pay | Admitting: Orthopedic Surgery

## 2018-04-22 ENCOUNTER — Other Ambulatory Visit: Payer: Self-pay | Admitting: Family Medicine

## 2018-04-22 ENCOUNTER — Ambulatory Visit
Admission: RE | Admit: 2018-04-22 | Discharge: 2018-04-22 | Disposition: A | Discharge status: Home / Self Care | Payer: Commercial Managed Care - PPO | Source: Ambulatory Visit | Attending: Family Medicine | Admitting: Family Medicine

## 2018-04-22 DIAGNOSIS — L814 Other melanin hyperpigmentation: Secondary | ICD-10-CM | POA: Diagnosis not present

## 2018-04-22 DIAGNOSIS — L722 Steatocystoma multiplex: Secondary | ICD-10-CM | POA: Diagnosis not present

## 2018-04-22 DIAGNOSIS — Z1231 Encounter for screening mammogram for malignant neoplasm of breast: Secondary | ICD-10-CM | POA: Diagnosis not present

## 2018-04-22 DIAGNOSIS — D485 Neoplasm of uncertain behavior of skin: Secondary | ICD-10-CM | POA: Diagnosis not present

## 2018-04-22 DIAGNOSIS — L700 Acne vulgaris: Secondary | ICD-10-CM | POA: Diagnosis not present

## 2018-04-22 DIAGNOSIS — L570 Actinic keratosis: Secondary | ICD-10-CM | POA: Diagnosis not present

## 2018-04-22 DIAGNOSIS — B078 Other viral warts: Secondary | ICD-10-CM | POA: Diagnosis not present

## 2018-04-22 DIAGNOSIS — D1801 Hemangioma of skin and subcutaneous tissue: Secondary | ICD-10-CM | POA: Diagnosis not present

## 2018-04-29 DIAGNOSIS — M2042 Other hammer toe(s) (acquired), left foot: Secondary | ICD-10-CM | POA: Diagnosis not present

## 2018-04-29 DIAGNOSIS — M21619 Bunion of unspecified foot: Secondary | ICD-10-CM | POA: Diagnosis not present

## 2018-04-29 DIAGNOSIS — M79672 Pain in left foot: Secondary | ICD-10-CM | POA: Diagnosis not present

## 2018-05-05 DIAGNOSIS — M79672 Pain in left foot: Secondary | ICD-10-CM | POA: Diagnosis not present

## 2018-05-05 DIAGNOSIS — R2689 Other abnormalities of gait and mobility: Secondary | ICD-10-CM | POA: Diagnosis not present

## 2018-05-05 DIAGNOSIS — M7989 Other specified soft tissue disorders: Secondary | ICD-10-CM | POA: Diagnosis not present

## 2018-05-10 DIAGNOSIS — M7989 Other specified soft tissue disorders: Secondary | ICD-10-CM | POA: Diagnosis not present

## 2018-05-10 DIAGNOSIS — R2689 Other abnormalities of gait and mobility: Secondary | ICD-10-CM | POA: Diagnosis not present

## 2018-05-10 DIAGNOSIS — M79672 Pain in left foot: Secondary | ICD-10-CM | POA: Diagnosis not present

## 2018-05-12 DIAGNOSIS — M7989 Other specified soft tissue disorders: Secondary | ICD-10-CM | POA: Diagnosis not present

## 2018-05-12 DIAGNOSIS — R2689 Other abnormalities of gait and mobility: Secondary | ICD-10-CM | POA: Diagnosis not present

## 2018-05-12 DIAGNOSIS — M79672 Pain in left foot: Secondary | ICD-10-CM | POA: Diagnosis not present

## 2018-05-18 DIAGNOSIS — M7989 Other specified soft tissue disorders: Secondary | ICD-10-CM | POA: Diagnosis not present

## 2018-05-18 DIAGNOSIS — R2689 Other abnormalities of gait and mobility: Secondary | ICD-10-CM | POA: Diagnosis not present

## 2018-05-18 DIAGNOSIS — M79672 Pain in left foot: Secondary | ICD-10-CM | POA: Diagnosis not present

## 2018-05-20 DIAGNOSIS — M79672 Pain in left foot: Secondary | ICD-10-CM | POA: Diagnosis not present

## 2018-05-20 DIAGNOSIS — M7989 Other specified soft tissue disorders: Secondary | ICD-10-CM | POA: Diagnosis not present

## 2018-05-20 DIAGNOSIS — R2689 Other abnormalities of gait and mobility: Secondary | ICD-10-CM | POA: Diagnosis not present

## 2018-05-23 DIAGNOSIS — M79672 Pain in left foot: Secondary | ICD-10-CM | POA: Diagnosis not present

## 2018-05-24 DIAGNOSIS — R2689 Other abnormalities of gait and mobility: Secondary | ICD-10-CM | POA: Diagnosis not present

## 2018-05-24 DIAGNOSIS — M79672 Pain in left foot: Secondary | ICD-10-CM | POA: Diagnosis not present

## 2018-05-24 DIAGNOSIS — M7989 Other specified soft tissue disorders: Secondary | ICD-10-CM | POA: Diagnosis not present

## 2018-05-31 DIAGNOSIS — M79672 Pain in left foot: Secondary | ICD-10-CM | POA: Diagnosis not present

## 2018-05-31 DIAGNOSIS — R2689 Other abnormalities of gait and mobility: Secondary | ICD-10-CM | POA: Diagnosis not present

## 2018-05-31 DIAGNOSIS — M7989 Other specified soft tissue disorders: Secondary | ICD-10-CM | POA: Diagnosis not present

## 2018-06-02 DIAGNOSIS — M7989 Other specified soft tissue disorders: Secondary | ICD-10-CM | POA: Diagnosis not present

## 2018-06-02 DIAGNOSIS — R2689 Other abnormalities of gait and mobility: Secondary | ICD-10-CM | POA: Diagnosis not present

## 2018-06-02 DIAGNOSIS — M79672 Pain in left foot: Secondary | ICD-10-CM | POA: Diagnosis not present

## 2018-06-13 DIAGNOSIS — M7989 Other specified soft tissue disorders: Secondary | ICD-10-CM | POA: Diagnosis not present

## 2018-06-13 DIAGNOSIS — R2689 Other abnormalities of gait and mobility: Secondary | ICD-10-CM | POA: Diagnosis not present

## 2018-06-13 DIAGNOSIS — M79672 Pain in left foot: Secondary | ICD-10-CM | POA: Diagnosis not present

## 2018-06-20 DIAGNOSIS — M79672 Pain in left foot: Secondary | ICD-10-CM | POA: Diagnosis not present

## 2018-06-20 DIAGNOSIS — M2042 Other hammer toe(s) (acquired), left foot: Secondary | ICD-10-CM | POA: Diagnosis not present

## 2018-06-20 DIAGNOSIS — M21619 Bunion of unspecified foot: Secondary | ICD-10-CM | POA: Diagnosis not present

## 2018-06-22 DIAGNOSIS — D485 Neoplasm of uncertain behavior of skin: Secondary | ICD-10-CM | POA: Diagnosis not present

## 2018-06-22 DIAGNOSIS — L821 Other seborrheic keratosis: Secondary | ICD-10-CM | POA: Diagnosis not present

## 2018-06-22 DIAGNOSIS — L718 Other rosacea: Secondary | ICD-10-CM | POA: Diagnosis not present

## 2018-06-23 DIAGNOSIS — H04123 Dry eye syndrome of bilateral lacrimal glands: Secondary | ICD-10-CM | POA: Diagnosis not present

## 2018-06-23 DIAGNOSIS — H16143 Punctate keratitis, bilateral: Secondary | ICD-10-CM | POA: Diagnosis not present

## 2018-07-08 DIAGNOSIS — Z23 Encounter for immunization: Secondary | ICD-10-CM | POA: Diagnosis not present

## 2018-07-19 DIAGNOSIS — J309 Allergic rhinitis, unspecified: Secondary | ICD-10-CM | POA: Diagnosis not present

## 2018-07-19 DIAGNOSIS — G43009 Migraine without aura, not intractable, without status migrainosus: Secondary | ICD-10-CM | POA: Diagnosis not present

## 2018-08-19 DIAGNOSIS — H0100A Unspecified blepharitis right eye, upper and lower eyelids: Secondary | ICD-10-CM | POA: Diagnosis not present

## 2018-08-19 DIAGNOSIS — H0100B Unspecified blepharitis left eye, upper and lower eyelids: Secondary | ICD-10-CM | POA: Diagnosis not present

## 2018-08-19 DIAGNOSIS — H04123 Dry eye syndrome of bilateral lacrimal glands: Secondary | ICD-10-CM | POA: Diagnosis not present

## 2018-08-23 DIAGNOSIS — F514 Sleep terrors [night terrors]: Secondary | ICD-10-CM | POA: Diagnosis not present

## 2018-09-20 DIAGNOSIS — H10523 Angular blepharoconjunctivitis, bilateral: Secondary | ICD-10-CM | POA: Diagnosis not present

## 2018-11-17 DIAGNOSIS — L718 Other rosacea: Secondary | ICD-10-CM | POA: Diagnosis not present

## 2018-11-17 DIAGNOSIS — L710 Perioral dermatitis: Secondary | ICD-10-CM | POA: Diagnosis not present

## 2018-11-17 DIAGNOSIS — D485 Neoplasm of uncertain behavior of skin: Secondary | ICD-10-CM | POA: Diagnosis not present

## 2018-11-17 DIAGNOSIS — L821 Other seborrheic keratosis: Secondary | ICD-10-CM | POA: Diagnosis not present

## 2018-11-17 DIAGNOSIS — L570 Actinic keratosis: Secondary | ICD-10-CM | POA: Diagnosis not present

## 2018-11-24 DIAGNOSIS — M545 Low back pain: Secondary | ICD-10-CM | POA: Diagnosis not present

## 2019-01-16 DIAGNOSIS — J309 Allergic rhinitis, unspecified: Secondary | ICD-10-CM | POA: Diagnosis not present

## 2019-01-16 DIAGNOSIS — G43009 Migraine without aura, not intractable, without status migrainosus: Secondary | ICD-10-CM | POA: Diagnosis not present

## 2019-03-14 ENCOUNTER — Other Ambulatory Visit: Payer: Self-pay | Admitting: Family Medicine

## 2019-03-14 DIAGNOSIS — Z1231 Encounter for screening mammogram for malignant neoplasm of breast: Secondary | ICD-10-CM

## 2019-05-02 ENCOUNTER — Ambulatory Visit
Admission: RE | Admit: 2019-05-02 | Discharge: 2019-05-02 | Disposition: A | Discharge status: Home / Self Care | Payer: Commercial Managed Care - PPO | Source: Ambulatory Visit | Attending: Family Medicine | Admitting: Family Medicine

## 2019-05-02 ENCOUNTER — Other Ambulatory Visit: Payer: Self-pay

## 2019-05-02 DIAGNOSIS — Z1231 Encounter for screening mammogram for malignant neoplasm of breast: Secondary | ICD-10-CM

## 2019-08-21 ENCOUNTER — Other Ambulatory Visit: Payer: Self-pay | Admitting: Family Medicine

## 2019-08-21 DIAGNOSIS — E781 Pure hyperglyceridemia: Secondary | ICD-10-CM

## 2019-08-30 ENCOUNTER — Ambulatory Visit
Admission: RE | Admit: 2019-08-30 | Discharge: 2019-08-30 | Disposition: A | Discharge status: Home / Self Care | Payer: Commercial Managed Care - PPO | Source: Ambulatory Visit | Attending: Family Medicine | Admitting: Family Medicine

## 2019-08-30 DIAGNOSIS — E781 Pure hyperglyceridemia: Secondary | ICD-10-CM

## 2020-03-18 ENCOUNTER — Other Ambulatory Visit: Payer: Self-pay | Admitting: Family Medicine

## 2020-03-18 DIAGNOSIS — Z1231 Encounter for screening mammogram for malignant neoplasm of breast: Secondary | ICD-10-CM

## 2020-05-02 ENCOUNTER — Ambulatory Visit: Payer: No Typology Code available for payment source

## 2020-05-10 ENCOUNTER — Ambulatory Visit
Admission: RE | Admit: 2020-05-10 | Discharge: 2020-05-10 | Disposition: A | Discharge status: Home / Self Care | Payer: Commercial Managed Care - PPO | Source: Ambulatory Visit | Attending: Family Medicine | Admitting: Family Medicine

## 2020-05-10 ENCOUNTER — Other Ambulatory Visit: Payer: Self-pay

## 2020-05-10 DIAGNOSIS — Z1231 Encounter for screening mammogram for malignant neoplasm of breast: Secondary | ICD-10-CM

## 2020-09-06 DIAGNOSIS — I10 Essential (primary) hypertension: Secondary | ICD-10-CM | POA: Diagnosis not present

## 2020-09-06 DIAGNOSIS — I2510 Atherosclerotic heart disease of native coronary artery without angina pectoris: Secondary | ICD-10-CM | POA: Diagnosis not present

## 2020-09-06 DIAGNOSIS — F419 Anxiety disorder, unspecified: Secondary | ICD-10-CM | POA: Diagnosis not present

## 2020-09-06 DIAGNOSIS — Z6826 Body mass index (BMI) 26.0-26.9, adult: Secondary | ICD-10-CM | POA: Diagnosis not present

## 2021-02-28 DIAGNOSIS — I10 Essential (primary) hypertension: Secondary | ICD-10-CM | POA: Diagnosis not present

## 2021-02-28 DIAGNOSIS — K219 Gastro-esophageal reflux disease without esophagitis: Secondary | ICD-10-CM | POA: Diagnosis not present

## 2021-02-28 DIAGNOSIS — G43009 Migraine without aura, not intractable, without status migrainosus: Secondary | ICD-10-CM | POA: Diagnosis not present

## 2021-02-28 DIAGNOSIS — E7800 Pure hypercholesterolemia, unspecified: Secondary | ICD-10-CM | POA: Diagnosis not present

## 2021-03-12 DIAGNOSIS — L720 Epidermal cyst: Secondary | ICD-10-CM | POA: Diagnosis not present

## 2021-03-12 DIAGNOSIS — L438 Other lichen planus: Secondary | ICD-10-CM | POA: Diagnosis not present

## 2021-03-12 DIAGNOSIS — L281 Prurigo nodularis: Secondary | ICD-10-CM | POA: Diagnosis not present

## 2021-03-24 DIAGNOSIS — E7800 Pure hypercholesterolemia, unspecified: Secondary | ICD-10-CM | POA: Diagnosis not present

## 2021-03-24 DIAGNOSIS — L659 Nonscarring hair loss, unspecified: Secondary | ICD-10-CM | POA: Diagnosis not present

## 2021-03-24 DIAGNOSIS — Z0000 Encounter for general adult medical examination without abnormal findings: Secondary | ICD-10-CM | POA: Diagnosis not present

## 2021-03-24 DIAGNOSIS — I2510 Atherosclerotic heart disease of native coronary artery without angina pectoris: Secondary | ICD-10-CM | POA: Diagnosis not present

## 2021-03-24 DIAGNOSIS — R739 Hyperglycemia, unspecified: Secondary | ICD-10-CM | POA: Diagnosis not present

## 2021-03-24 DIAGNOSIS — I10 Essential (primary) hypertension: Secondary | ICD-10-CM | POA: Diagnosis not present

## 2021-03-24 DIAGNOSIS — R61 Generalized hyperhidrosis: Secondary | ICD-10-CM | POA: Diagnosis not present

## 2021-03-28 ENCOUNTER — Other Ambulatory Visit: Payer: Self-pay | Admitting: Family Medicine

## 2021-03-28 DIAGNOSIS — Z1231 Encounter for screening mammogram for malignant neoplasm of breast: Secondary | ICD-10-CM

## 2021-04-23 DIAGNOSIS — Z124 Encounter for screening for malignant neoplasm of cervix: Secondary | ICD-10-CM | POA: Diagnosis not present

## 2021-04-23 DIAGNOSIS — R6882 Decreased libido: Secondary | ICD-10-CM | POA: Diagnosis not present

## 2021-04-23 DIAGNOSIS — Z01419 Encounter for gynecological examination (general) (routine) without abnormal findings: Secondary | ICD-10-CM | POA: Diagnosis not present

## 2021-04-23 DIAGNOSIS — Z6825 Body mass index (BMI) 25.0-25.9, adult: Secondary | ICD-10-CM | POA: Diagnosis not present

## 2021-04-23 DIAGNOSIS — R232 Flushing: Secondary | ICD-10-CM | POA: Diagnosis not present

## 2021-04-23 DIAGNOSIS — M816 Localized osteoporosis [Lequesne]: Secondary | ICD-10-CM | POA: Diagnosis not present

## 2021-04-23 DIAGNOSIS — N958 Other specified menopausal and perimenopausal disorders: Secondary | ICD-10-CM | POA: Diagnosis not present

## 2021-05-21 ENCOUNTER — Other Ambulatory Visit: Payer: Self-pay

## 2021-05-21 ENCOUNTER — Ambulatory Visit
Admission: RE | Admit: 2021-05-21 | Discharge: 2021-05-21 | Disposition: A | Discharge status: Home / Self Care | Payer: Medicare Other | Source: Ambulatory Visit | Attending: Family Medicine | Admitting: Family Medicine

## 2021-05-21 DIAGNOSIS — Z1231 Encounter for screening mammogram for malignant neoplasm of breast: Secondary | ICD-10-CM | POA: Diagnosis not present

## 2021-06-10 DIAGNOSIS — H52223 Regular astigmatism, bilateral: Secondary | ICD-10-CM | POA: Diagnosis not present

## 2021-06-26 DIAGNOSIS — M549 Dorsalgia, unspecified: Secondary | ICD-10-CM | POA: Diagnosis not present

## 2021-06-30 DIAGNOSIS — M898X1 Other specified disorders of bone, shoulder: Secondary | ICD-10-CM | POA: Diagnosis not present

## 2021-07-02 DIAGNOSIS — L309 Dermatitis, unspecified: Secondary | ICD-10-CM | POA: Diagnosis not present

## 2021-07-09 DIAGNOSIS — Z23 Encounter for immunization: Secondary | ICD-10-CM | POA: Diagnosis not present

## 2021-07-21 DIAGNOSIS — M898X1 Other specified disorders of bone, shoulder: Secondary | ICD-10-CM | POA: Diagnosis not present

## 2021-08-01 DIAGNOSIS — R293 Abnormal posture: Secondary | ICD-10-CM | POA: Diagnosis not present

## 2021-08-01 DIAGNOSIS — M2569 Stiffness of other specified joint, not elsewhere classified: Secondary | ICD-10-CM | POA: Diagnosis not present

## 2021-08-01 DIAGNOSIS — M546 Pain in thoracic spine: Secondary | ICD-10-CM | POA: Diagnosis not present

## 2021-08-04 DIAGNOSIS — R293 Abnormal posture: Secondary | ICD-10-CM | POA: Diagnosis not present

## 2021-08-04 DIAGNOSIS — M546 Pain in thoracic spine: Secondary | ICD-10-CM | POA: Diagnosis not present

## 2021-08-04 DIAGNOSIS — M2569 Stiffness of other specified joint, not elsewhere classified: Secondary | ICD-10-CM | POA: Diagnosis not present

## 2021-08-05 DIAGNOSIS — R293 Abnormal posture: Secondary | ICD-10-CM | POA: Diagnosis not present

## 2021-08-05 DIAGNOSIS — M2569 Stiffness of other specified joint, not elsewhere classified: Secondary | ICD-10-CM | POA: Diagnosis not present

## 2021-08-05 DIAGNOSIS — M546 Pain in thoracic spine: Secondary | ICD-10-CM | POA: Diagnosis not present

## 2021-08-11 DIAGNOSIS — M2569 Stiffness of other specified joint, not elsewhere classified: Secondary | ICD-10-CM | POA: Diagnosis not present

## 2021-08-11 DIAGNOSIS — R293 Abnormal posture: Secondary | ICD-10-CM | POA: Diagnosis not present

## 2021-08-11 DIAGNOSIS — M546 Pain in thoracic spine: Secondary | ICD-10-CM | POA: Diagnosis not present

## 2021-08-14 DIAGNOSIS — M2569 Stiffness of other specified joint, not elsewhere classified: Secondary | ICD-10-CM | POA: Diagnosis not present

## 2021-08-14 DIAGNOSIS — M546 Pain in thoracic spine: Secondary | ICD-10-CM | POA: Diagnosis not present

## 2021-08-14 DIAGNOSIS — R293 Abnormal posture: Secondary | ICD-10-CM | POA: Diagnosis not present

## 2021-08-21 DIAGNOSIS — K5730 Diverticulosis of large intestine without perforation or abscess without bleeding: Secondary | ICD-10-CM | POA: Diagnosis not present

## 2021-08-21 DIAGNOSIS — G43009 Migraine without aura, not intractable, without status migrainosus: Secondary | ICD-10-CM | POA: Diagnosis not present

## 2021-08-21 DIAGNOSIS — I10 Essential (primary) hypertension: Secondary | ICD-10-CM | POA: Diagnosis not present

## 2021-08-21 DIAGNOSIS — I2510 Atherosclerotic heart disease of native coronary artery without angina pectoris: Secondary | ICD-10-CM | POA: Diagnosis not present

## 2021-08-21 DIAGNOSIS — K219 Gastro-esophageal reflux disease without esophagitis: Secondary | ICD-10-CM | POA: Diagnosis not present

## 2021-08-21 DIAGNOSIS — Z86010 Personal history of colonic polyps: Secondary | ICD-10-CM | POA: Diagnosis not present

## 2021-08-22 DIAGNOSIS — R293 Abnormal posture: Secondary | ICD-10-CM | POA: Diagnosis not present

## 2021-08-22 DIAGNOSIS — M546 Pain in thoracic spine: Secondary | ICD-10-CM | POA: Diagnosis not present

## 2021-08-22 DIAGNOSIS — M2569 Stiffness of other specified joint, not elsewhere classified: Secondary | ICD-10-CM | POA: Diagnosis not present

## 2021-08-26 DIAGNOSIS — R293 Abnormal posture: Secondary | ICD-10-CM | POA: Diagnosis not present

## 2021-08-26 DIAGNOSIS — M2569 Stiffness of other specified joint, not elsewhere classified: Secondary | ICD-10-CM | POA: Diagnosis not present

## 2021-08-26 DIAGNOSIS — M546 Pain in thoracic spine: Secondary | ICD-10-CM | POA: Diagnosis not present

## 2021-08-29 DIAGNOSIS — M2569 Stiffness of other specified joint, not elsewhere classified: Secondary | ICD-10-CM | POA: Diagnosis not present

## 2021-08-29 DIAGNOSIS — M546 Pain in thoracic spine: Secondary | ICD-10-CM | POA: Diagnosis not present

## 2021-08-29 DIAGNOSIS — R293 Abnormal posture: Secondary | ICD-10-CM | POA: Diagnosis not present

## 2021-09-03 DIAGNOSIS — M898X1 Other specified disorders of bone, shoulder: Secondary | ICD-10-CM | POA: Diagnosis not present

## 2021-09-04 DIAGNOSIS — M546 Pain in thoracic spine: Secondary | ICD-10-CM | POA: Diagnosis not present

## 2021-09-04 DIAGNOSIS — R293 Abnormal posture: Secondary | ICD-10-CM | POA: Diagnosis not present

## 2021-09-04 DIAGNOSIS — M2569 Stiffness of other specified joint, not elsewhere classified: Secondary | ICD-10-CM | POA: Diagnosis not present

## 2021-09-09 DIAGNOSIS — L814 Other melanin hyperpigmentation: Secondary | ICD-10-CM | POA: Diagnosis not present

## 2021-09-09 DIAGNOSIS — L718 Other rosacea: Secondary | ICD-10-CM | POA: Diagnosis not present

## 2021-09-09 DIAGNOSIS — D0322 Melanoma in situ of left ear and external auricular canal: Secondary | ICD-10-CM | POA: Diagnosis not present

## 2021-09-09 DIAGNOSIS — D485 Neoplasm of uncertain behavior of skin: Secondary | ICD-10-CM | POA: Diagnosis not present

## 2021-09-09 DIAGNOSIS — L720 Epidermal cyst: Secondary | ICD-10-CM | POA: Diagnosis not present

## 2021-09-12 DIAGNOSIS — M2569 Stiffness of other specified joint, not elsewhere classified: Secondary | ICD-10-CM | POA: Diagnosis not present

## 2021-09-12 DIAGNOSIS — R293 Abnormal posture: Secondary | ICD-10-CM | POA: Diagnosis not present

## 2021-09-12 DIAGNOSIS — M546 Pain in thoracic spine: Secondary | ICD-10-CM | POA: Diagnosis not present

## 2021-09-18 DIAGNOSIS — D0322 Melanoma in situ of left ear and external auricular canal: Secondary | ICD-10-CM | POA: Diagnosis not present

## 2022-03-31 DIAGNOSIS — R739 Hyperglycemia, unspecified: Secondary | ICD-10-CM | POA: Diagnosis not present

## 2022-03-31 DIAGNOSIS — Z0000 Encounter for general adult medical examination without abnormal findings: Secondary | ICD-10-CM | POA: Diagnosis not present

## 2022-03-31 DIAGNOSIS — R635 Abnormal weight gain: Secondary | ICD-10-CM | POA: Diagnosis not present

## 2022-03-31 DIAGNOSIS — E663 Overweight: Secondary | ICD-10-CM | POA: Diagnosis not present

## 2022-03-31 DIAGNOSIS — R61 Generalized hyperhidrosis: Secondary | ICD-10-CM | POA: Diagnosis not present

## 2022-03-31 DIAGNOSIS — F419 Anxiety disorder, unspecified: Secondary | ICD-10-CM | POA: Diagnosis not present

## 2022-03-31 DIAGNOSIS — Z23 Encounter for immunization: Secondary | ICD-10-CM | POA: Diagnosis not present

## 2022-03-31 DIAGNOSIS — I10 Essential (primary) hypertension: Secondary | ICD-10-CM | POA: Diagnosis not present

## 2022-03-31 DIAGNOSIS — G43009 Migraine without aura, not intractable, without status migrainosus: Secondary | ICD-10-CM | POA: Diagnosis not present

## 2022-04-02 DIAGNOSIS — L821 Other seborrheic keratosis: Secondary | ICD-10-CM | POA: Diagnosis not present

## 2022-04-02 DIAGNOSIS — L570 Actinic keratosis: Secondary | ICD-10-CM | POA: Diagnosis not present

## 2022-04-02 DIAGNOSIS — Z85820 Personal history of malignant melanoma of skin: Secondary | ICD-10-CM | POA: Diagnosis not present

## 2022-04-02 DIAGNOSIS — L814 Other melanin hyperpigmentation: Secondary | ICD-10-CM | POA: Diagnosis not present

## 2022-04-08 DIAGNOSIS — H5203 Hypermetropia, bilateral: Secondary | ICD-10-CM | POA: Diagnosis not present

## 2022-04-08 DIAGNOSIS — S76312A Strain of muscle, fascia and tendon of the posterior muscle group at thigh level, left thigh, initial encounter: Secondary | ICD-10-CM | POA: Diagnosis not present

## 2022-04-09 ENCOUNTER — Other Ambulatory Visit: Payer: Self-pay | Admitting: Family Medicine

## 2022-04-09 DIAGNOSIS — Z1231 Encounter for screening mammogram for malignant neoplasm of breast: Secondary | ICD-10-CM

## 2022-04-24 DIAGNOSIS — Z01419 Encounter for gynecological examination (general) (routine) without abnormal findings: Secondary | ICD-10-CM | POA: Diagnosis not present

## 2022-04-24 DIAGNOSIS — Z6826 Body mass index (BMI) 26.0-26.9, adult: Secondary | ICD-10-CM | POA: Diagnosis not present

## 2022-05-25 ENCOUNTER — Ambulatory Visit
Admission: RE | Admit: 2022-05-25 | Discharge: 2022-05-25 | Disposition: A | Discharge status: Home / Self Care | Payer: Medicare Other | Source: Ambulatory Visit | Attending: Family Medicine | Admitting: Family Medicine

## 2022-05-25 DIAGNOSIS — Z1231 Encounter for screening mammogram for malignant neoplasm of breast: Secondary | ICD-10-CM

## 2022-06-16 DIAGNOSIS — Z1331 Encounter for screening for depression: Secondary | ICD-10-CM | POA: Diagnosis not present

## 2022-06-16 DIAGNOSIS — E8889 Other specified metabolic disorders: Secondary | ICD-10-CM | POA: Diagnosis not present

## 2022-06-16 DIAGNOSIS — I10 Essential (primary) hypertension: Secondary | ICD-10-CM | POA: Diagnosis not present

## 2022-06-16 DIAGNOSIS — E7800 Pure hypercholesterolemia, unspecified: Secondary | ICD-10-CM | POA: Diagnosis not present

## 2022-06-30 DIAGNOSIS — K219 Gastro-esophageal reflux disease without esophagitis: Secondary | ICD-10-CM | POA: Diagnosis not present

## 2022-06-30 DIAGNOSIS — F419 Anxiety disorder, unspecified: Secondary | ICD-10-CM | POA: Diagnosis not present

## 2022-06-30 DIAGNOSIS — E663 Overweight: Secondary | ICD-10-CM | POA: Diagnosis not present

## 2022-07-08 DIAGNOSIS — R002 Palpitations: Secondary | ICD-10-CM | POA: Diagnosis not present

## 2022-07-08 DIAGNOSIS — L309 Dermatitis, unspecified: Secondary | ICD-10-CM | POA: Diagnosis not present

## 2022-07-08 DIAGNOSIS — Z23 Encounter for immunization: Secondary | ICD-10-CM | POA: Diagnosis not present

## 2022-07-08 DIAGNOSIS — E663 Overweight: Secondary | ICD-10-CM | POA: Diagnosis not present

## 2022-07-08 DIAGNOSIS — R5383 Other fatigue: Secondary | ICD-10-CM | POA: Diagnosis not present

## 2022-07-21 DIAGNOSIS — E663 Overweight: Secondary | ICD-10-CM | POA: Diagnosis not present

## 2022-07-21 DIAGNOSIS — I10 Essential (primary) hypertension: Secondary | ICD-10-CM | POA: Diagnosis not present

## 2022-07-21 DIAGNOSIS — Z6826 Body mass index (BMI) 26.0-26.9, adult: Secondary | ICD-10-CM | POA: Diagnosis not present

## 2022-07-21 DIAGNOSIS — K219 Gastro-esophageal reflux disease without esophagitis: Secondary | ICD-10-CM | POA: Diagnosis not present

## 2022-07-24 ENCOUNTER — Ambulatory Visit: Payer: Medicare Other | Admitting: Cardiology

## 2022-07-24 ENCOUNTER — Encounter: Payer: Self-pay | Admitting: Cardiology

## 2022-07-24 VITALS — BP 140/67 | HR 87 | Temp 96.7°F | Resp 16 | Ht 65.0 in | Wt 163.0 lb

## 2022-07-24 DIAGNOSIS — R0602 Shortness of breath: Secondary | ICD-10-CM

## 2022-07-24 DIAGNOSIS — R5383 Other fatigue: Secondary | ICD-10-CM

## 2022-07-24 DIAGNOSIS — E782 Mixed hyperlipidemia: Secondary | ICD-10-CM

## 2022-07-24 DIAGNOSIS — I10 Essential (primary) hypertension: Secondary | ICD-10-CM | POA: Diagnosis not present

## 2022-07-24 DIAGNOSIS — R002 Palpitations: Secondary | ICD-10-CM

## 2022-07-24 DIAGNOSIS — R0683 Snoring: Secondary | ICD-10-CM

## 2022-07-24 NOTE — Progress Notes (Signed)
ID:  LANICE FOLDEN, DOB 05-20-1956, MRN [id number]  PCP:  Kathyrn Lass, MD  Cardiologist:  Rex Kras, DO, [facility] (established care 07/24/2022)  REASON FOR CONSULT: Fatigue/difficulty breathing  REQUESTING PHYSICIAN:  Kathyrn Lass, MD [place],  [place] [address]  Chief Complaint  Patient presents with   Fatigue   New Patient (Initial Visit)    HPI  Melinda Hodge is a 66 y.o. Caucasian female who presents to the clinic for evaluation of fatigue/difficulty breathing at the request of Kathyrn Lass, MD. Her past medical history and cardiovascular risk factors include: Hyperlipidemia, hypertension, migraines, vitamin D deficiency, minimal coronary artery calcification (score 1, 55th percentile, 09/2019), former smoker.  Patient states that recently she has been noticing frequent episodes of feeling tired/fatigued.  Patient states that she triggers kids in the morning between 8-10 and after that she just feels exhausted/generalized fatigue.  She goes home takes a nap and she is back to baseline.  She denies anginal discomfort or heart failure symptoms.  Her overall functional capacity remains relatively stable.  She has noticed palpitations/racing heart rate and therefore she reduced the dose of Imitrex which she takes for migraines.  At times she has a hard time taking a deep breath in.  She denies dyspnea on exertion with effort related activities.  FUNCTIONAL STATUS: Treadmill 5 days a week for 20-30 minutes.    ALLERGIES: Allergies  Allergen Reactions   Latex Itching and Rash    MEDICATION LIST PRIOR TO VISIT: Current Meds  Medication Sig   acyclovir (ZOVIRAX) 400 MG tablet Take 400 mg by mouth every 8 (eight) hours.   ALPRAZolam (XANAX) 0.25 MG tablet Take 0.25 mg by mouth 2 (two) times daily as needed for anxiety.   atorvastatin (LIPITOR) 10 MG tablet Take 10 mg by mouth daily.   cetirizine (ZYRTEC) 10 MG tablet Take 10 mg by mouth daily.   Cholecalciferol  (VITAMIN D3) 50 MCG (2000 UT) TABS Take 1 tablet by mouth daily at 12 noon.   doxycycline (VIBRAMYCIN) 50 MG capsule Take 50 mg by mouth daily.   famotidine (PEPCID) 20 MG tablet Take 20 mg by mouth 2 (two) times daily.   fluticasone (FLONASE ALLERGY RELIEF) 50 MCG/ACT nasal spray Spray 1 spray every day by intranasal route.   losartan (COZAAR) 50 MG tablet Take 50 mg by mouth daily.   progesterone (PROMETRIUM) 200 MG capsule Take 200 mg by mouth daily.   SUMAtriptan (IMITREX) 100 MG tablet Take 0.5 tablets by mouth daily as needed.   triamcinolone cream (KENALOG) 0.1 % Apply topically 2 (two) times daily.   venlafaxine XR (EFFEXOR-XR) 75 MG 24 hr capsule Take 75 mg by mouth 3 (three) times daily.   vitamin B-12 (CYANOCOBALAMIN) 500 MCG tablet Take 500 mcg by mouth daily.   zinc gluconate 50 MG tablet Take 50 mg by mouth daily.     PAST MEDICAL HISTORY: Past Medical History:  Diagnosis Date   Anxiety    Depression    Hyperlipidemia    Hypertension    controlled by diet and exercise    PAST SURGICAL HISTORY: Past Surgical History:  Procedure Laterality Date   BREAST EXCISIONAL BIOPSY Right    BUNIONECTOMY WITH HAMMERTOE RECONSTRUCTION Left 03/17/2018   Procedure: Left First Metatarsal Scarf Osteotomy and Modified McBride Buionectomy; Left Second Metatarsal Weil Osteotomy and Hammertoe Correction;  Surgeon: Wylene Simmer, MD;  Location: [facility];  Service: Orthopedics;  Laterality: Left;   CESAREAN SECTION  times three    FAMILY HISTORY: The patient family history includes Hypertension in her mother.  SOCIAL HISTORY:  The patient  reports that she quit smoking about 41 years ago. Her smoking use included cigarettes. She has a 2.00 pack-year smoking history. She has never used smokeless tobacco. She reports current alcohol use of about 1.0 standard drink of alcohol per week. She reports that she does not use drugs.  REVIEW OF SYSTEMS: Review of Systems   Constitutional: Positive for malaise/fatigue.  Cardiovascular:  Positive for palpitations. Negative for chest pain, claudication, dyspnea on exertion, irregular heartbeat, leg swelling, near-syncope, orthopnea, paroxysmal nocturnal dyspnea and syncope.  Respiratory:  Positive for snoring. Negative for shortness of breath.        Difficulty breathing  Hematologic/Lymphatic: Negative for bleeding problem.  Musculoskeletal:  Negative for muscle cramps and myalgias.  Neurological:  Negative for dizziness and light-headedness.   PHYSICAL EXAM:    07/24/2022   12:45 PM 03/17/2018   10:16 AM 03/17/2018    9:17 AM  Vitals with BMI  Height _0     Weight 163 lbs    BMI 35.45    Systolic 625 638   Diastolic 67 75   Pulse 87 83 82   Orthostatic VS for the past 72 hrs (Last 3 readings):  Orthostatic BP Patient Position BP Location Cuff Size Orthostatic Pulse  07/24/22 1253 137/84 Standing Left Arm Normal 83  07/24/22 1252 136/79 Sitting Left Arm Normal 83  07/24/22 1251 144/74 Supine Left Arm Normal 83   Physical Exam  Constitutional: No distress.  Age appropriate, hemodynamically stable.   Neck: No JVD present.  Cardiovascular: Normal rate, regular rhythm, S1 normal, S2 normal, intact distal pulses and normal pulses. Exam reveals no gallop, no S3 and no S4.  No murmur heard. Pulmonary/Chest: Effort normal and breath sounds normal. No stridor. She has no wheezes. She has no rales.  Abdominal: Soft. Bowel sounds are normal. She exhibits no distension. There is no abdominal tenderness.  Musculoskeletal:        General: No edema.     Cervical back: Neck supple.  Neurological: She is alert and oriented to person, place, and time. She has intact cranial nerves (2-12).  Skin: Skin is warm and moist.   CARDIAC DATABASE: EKG: 07/24/2022: Normal sinus rhythm, 78 bpm, normal axis, without underlying ischemia or injury pattern.  Echocardiogram: No results found for this or any previous visit  from the past 1095 days.   Stress Testing: No results found for this or any previous visit from the past 1095 days.  Heart Catheterization: None  LABORATORY DATA: External Labs: Collected: 03/31/2022. A1c 5.6. TSH 2.88. BUN 14, creatinine 0.63. eGFR 97. Sodium 143, potassium 3.8, chloride 109, bicarb 28. AST 26, ALT 34, alkaline phosphatase 99. Hemoglobin 14.2 g/dL, hematocrit 42%. Total cholesterol 141, triglycerides 137, HDL 52, LDL 65, non-HDL 89   IMPRESSION:    ICD-10-CM   1. Shortness of breath  R06.02 EKG 12-Lead    PCV ECHOCARDIOGRAM COMPLETE    2. Palpitations  R00.2 LONG TERM MONITOR (3-14 DAYS)    3. Fatigue, unspecified type  R53.83     4. Benign hypertension  I10     5. Mixed hyperlipidemia  E78.2     6. Snoring  R06.83 Ambulatory referral to Sleep Studies       RECOMMENDATIONS: TAMAKIA PORTO is a 66 y.o. Caucasian female whose past medical history and cardiac risk factors include: Hyperlipidemia, hypertension, migraines, vitamin D deficiency,  minimal coronary artery calcification (score 1, 55th percentile, 09/2019), former smoker.  Shortness of breath She describes it as the inability to take a complete breath. No heart failure symptoms or change in physical endurance. TSH within normal limits. Hemoglobin within acceptable limits. EKG: Nonischemic Echo will be ordered to evaluate for structural heart disease and left ventricular systolic function.  Palpitations No identifiable reversible cause. Patient has reduced the dose of Imitrex to see if her symptoms improved. We will proceed with 7-day extended Holter monitor to evaluate for dysrhythmias  Fatigue, unspecified type Likely noncardiac. We will proceed with an echocardiogram to evaluate for LVEF and structural heart disease. No clear indication for stress test at this time as she is not having chest pain, angina, and no change in physical endurance.  EKG is nonischemic. Given her symptoms of  snoring/fatigue and the fact the symptoms improve after she takes a nap raises the clinical suspicion of sleep apnea.  Patient requesting referral.  Benign hypertension Office blood pressures within acceptable limits-but not at goal. Home blood pressures are better controlled. Medications reconciled. Reemphasized importance of low-salt diet. Currently managed by primary care provider.  Mixed hyperlipidemia Currently on statin therapy.  Lipids reviewed.  Currently managed by primary care provider.  Snoring See above  FINAL MEDICATION LIST END OF ENCOUNTER: No orders of the defined types were placed in this encounter.   Medications Discontinued During This Encounter  Medication Reason   cholecalciferol (VITAMIN D) 1000 units tablet    docusate sodium (COLACE) 100 MG capsule    estradiol (VIVELLE-DOT) 0.025 MG/24HR    senna (SENOKOT) 8.6 MG TABS tablet    SUMAtriptan (IMITREX) 100 MG tablet    valACYclovir (VALTREX) 500 MG tablet    progesterone (PROMETRIUM) 100 MG capsule Change in therapy     Current Outpatient Medications:    acyclovir (ZOVIRAX) 400 MG tablet, Take 400 mg by mouth every 8 (eight) hours., Disp: , Rfl:    ALPRAZolam (XANAX) 0.25 MG tablet, Take 0.25 mg by mouth 2 (two) times daily as needed for anxiety., Disp: , Rfl:    atorvastatin (LIPITOR) 10 MG tablet, Take 10 mg by mouth daily., Disp: , Rfl:    cetirizine (ZYRTEC) 10 MG tablet, Take 10 mg by mouth daily., Disp: , Rfl:    Cholecalciferol (VITAMIN D3) 50 MCG (2000 UT) TABS, Take 1 tablet by mouth daily at 12 noon., Disp: , Rfl:    doxycycline (VIBRAMYCIN) 50 MG capsule, Take 50 mg by mouth daily., Disp: , Rfl:    famotidine (PEPCID) 20 MG tablet, Take 20 mg by mouth 2 (two) times daily., Disp: , Rfl:    fluticasone (FLONASE ALLERGY RELIEF) 50 MCG/ACT nasal spray, Spray 1 spray every day by intranasal route., Disp: , Rfl:    losartan (COZAAR) 50 MG tablet, Take 50 mg by mouth daily., Disp: , Rfl:     progesterone (PROMETRIUM) 200 MG capsule, Take 200 mg by mouth daily., Disp: , Rfl:    SUMAtriptan (IMITREX) 100 MG tablet, Take 0.5 tablets by mouth daily as needed., Disp: , Rfl:    triamcinolone cream (KENALOG) 0.1 %, Apply topically 2 (two) times daily., Disp: , Rfl:    venlafaxine XR (EFFEXOR-XR) 75 MG 24 hr capsule, Take 75 mg by mouth 3 (three) times daily., Disp: , Rfl:    vitamin B-12 (CYANOCOBALAMIN) 500 MCG tablet, Take 500 mcg by mouth daily., Disp: , Rfl:    zinc gluconate 50 MG tablet, Take 50 mg by mouth daily., Disp: ,  Rfl:   Orders Placed This Encounter  Procedures   Ambulatory referral to Sleep Studies   LONG TERM MONITOR (3-14 DAYS)   EKG 12-Lead   PCV ECHOCARDIOGRAM COMPLETE    There are no Patient Instructions on file for this visit.   --Continue cardiac medications as reconciled in final medication list. --Return in about 7 weeks (around 09/11/2022) for Follow up, Review test results. or sooner if needed. --Continue follow-up with your primary care physician regarding the management of your other chronic comorbid conditions.  Patient's questions and concerns were addressed to her satisfaction. She voices understanding of the instructions provided during this encounter.   This note was created using a voice recognition software as a result there may be grammatical errors inadvertently enclosed that do not reflect the nature of this encounter. Every attempt is made to correct such errors.  Rex Kras, [place], [facility]  Pager: [phone] Office: [phone]

## 2022-07-28 ENCOUNTER — Ambulatory Visit: Payer: Medicare Other

## 2022-07-28 DIAGNOSIS — R002 Palpitations: Secondary | ICD-10-CM

## 2022-08-07 DIAGNOSIS — R002 Palpitations: Secondary | ICD-10-CM | POA: Diagnosis not present

## 2022-08-12 ENCOUNTER — Ambulatory Visit: Payer: Medicare Other

## 2022-08-12 DIAGNOSIS — R0602 Shortness of breath: Secondary | ICD-10-CM | POA: Diagnosis not present

## 2022-08-18 DIAGNOSIS — Z6825 Body mass index (BMI) 25.0-25.9, adult: Secondary | ICD-10-CM | POA: Diagnosis not present

## 2022-08-18 DIAGNOSIS — R0683 Snoring: Secondary | ICD-10-CM | POA: Diagnosis not present

## 2022-08-18 DIAGNOSIS — E663 Overweight: Secondary | ICD-10-CM | POA: Diagnosis not present

## 2022-08-18 DIAGNOSIS — I10 Essential (primary) hypertension: Secondary | ICD-10-CM | POA: Diagnosis not present

## 2022-09-05 DIAGNOSIS — R002 Palpitations: Secondary | ICD-10-CM | POA: Diagnosis not present

## 2022-09-08 DIAGNOSIS — G4719 Other hypersomnia: Secondary | ICD-10-CM | POA: Diagnosis not present

## 2022-09-08 IMAGING — MG MM DIGITAL SCREENING BILAT W/ TOMO AND CAD
6 of 10 series · 6 of 30 positions shown · non-contrast
Comparison: Previous exam(s).

CLINICAL DATA: Screening.

EXAM:
DIGITAL SCREENING BILATERAL MAMMOGRAM WITH TOMOSYNTHESIS AND CAD
TECHNIQUE: Bilateral screening digital craniocaudal and mediolateral oblique
mammograms were obtained. Bilateral screening digital breast
tomosynthesis was performed. The images were evaluated with
computer-aided detection.

[L CC synth-2D (1 of 2)]
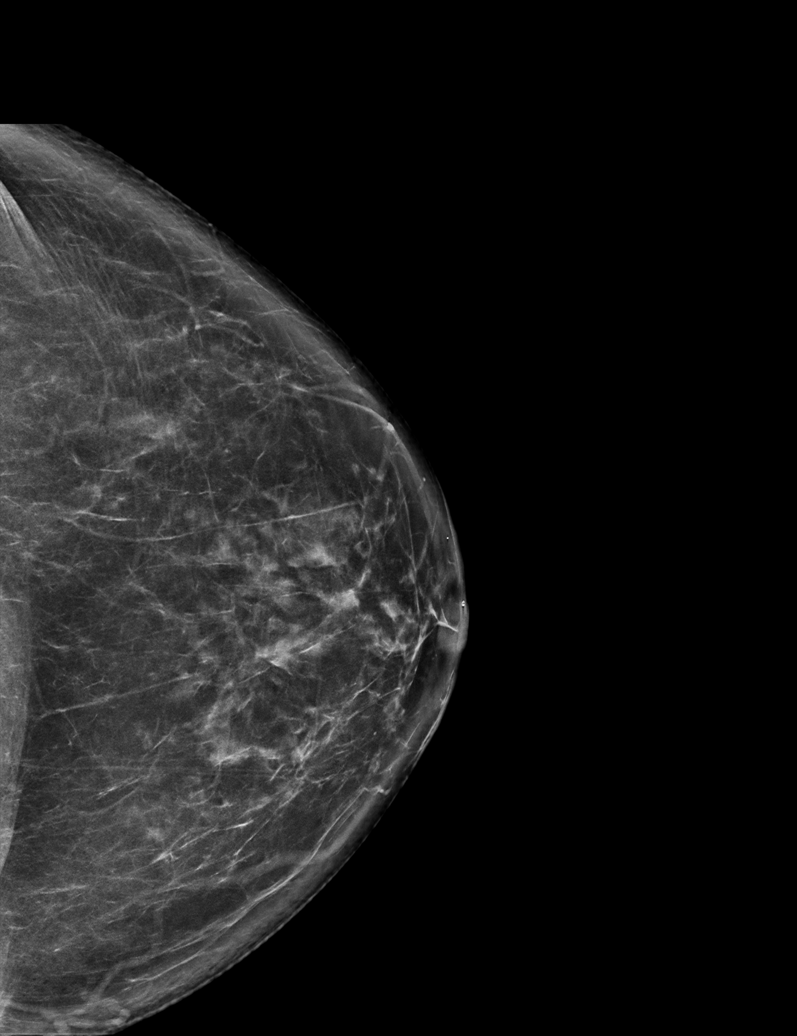

[L CC synth-2D (2 of 2)]
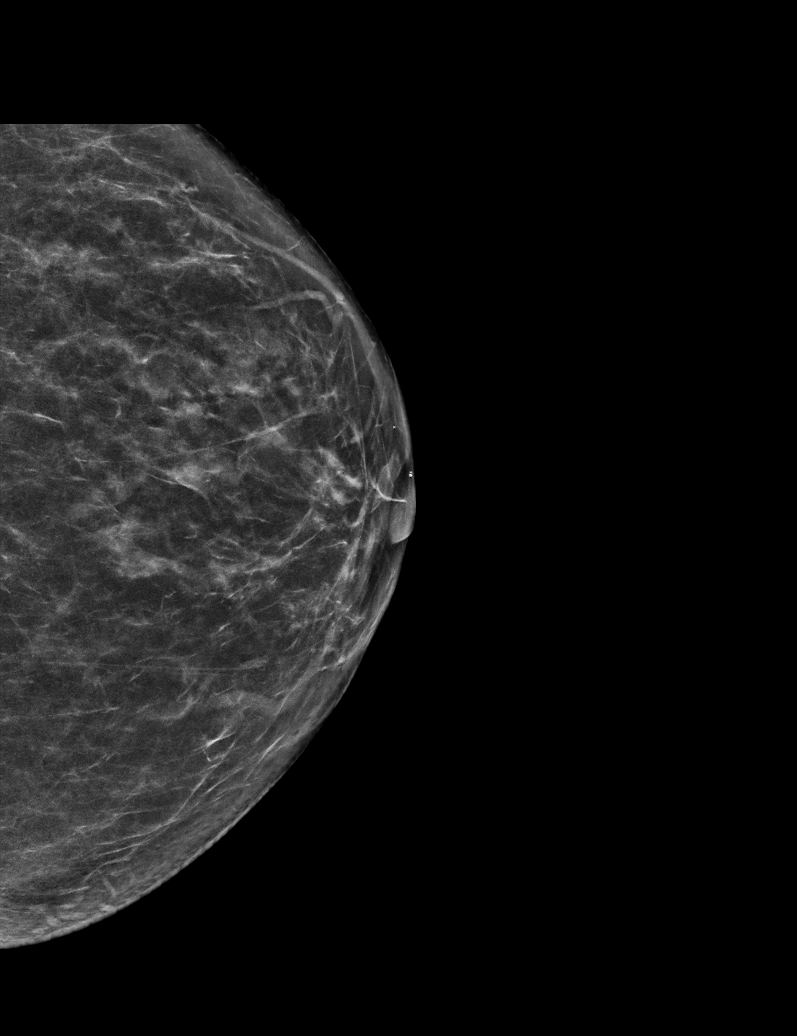

[L MLO synth-2D]
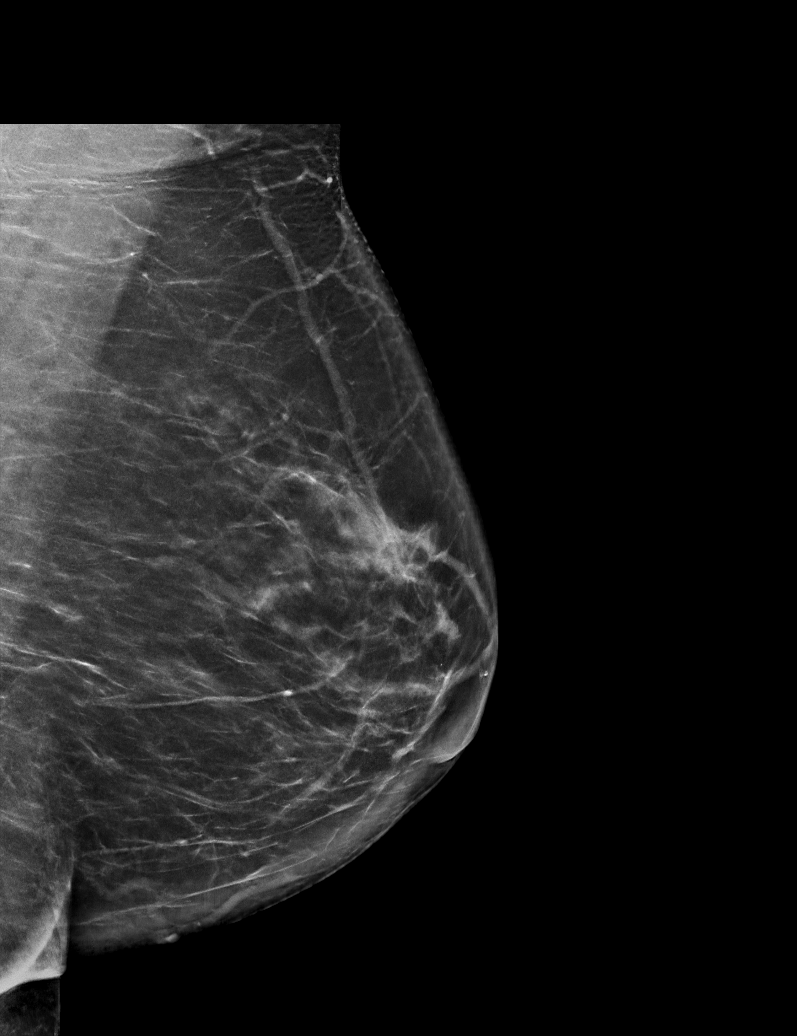

[R MLO synth-2D]
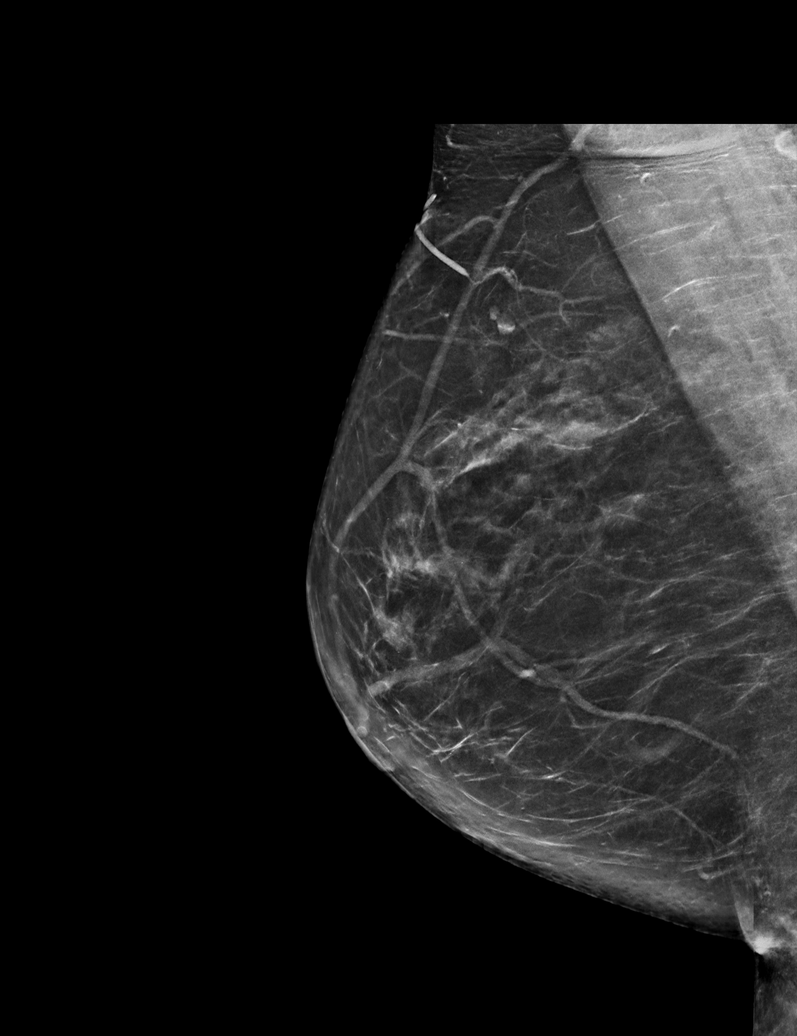

[R CC synth-2D]
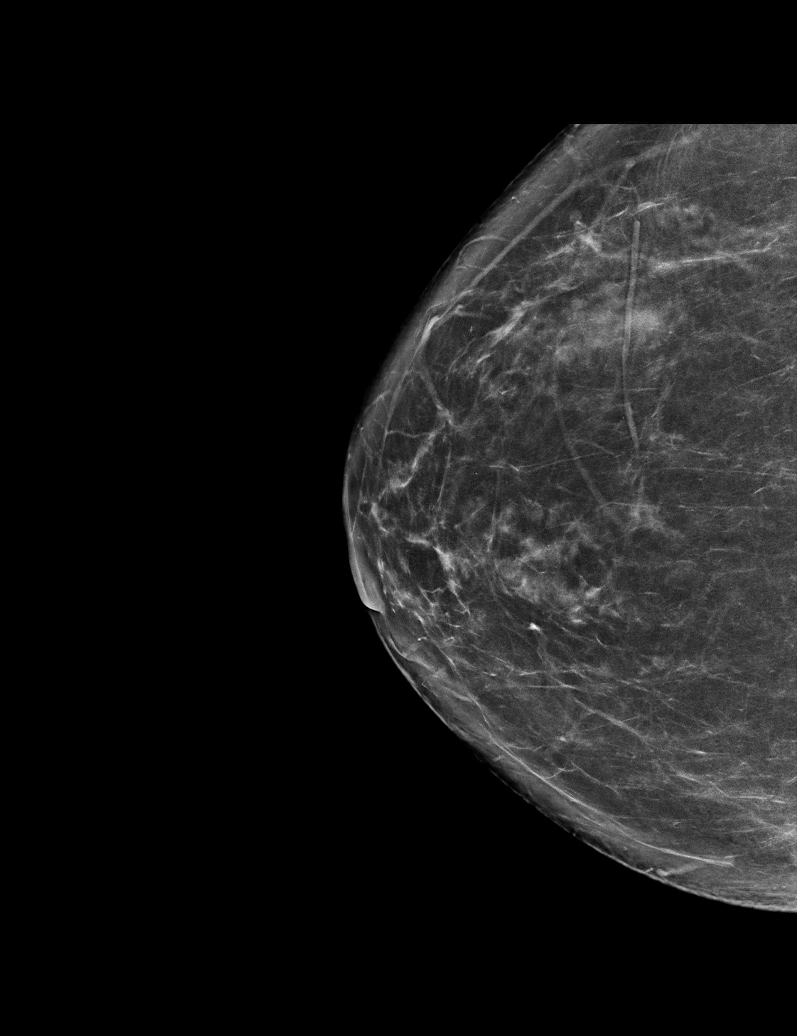

[R CC tomo · tomo slice 37/73.0]
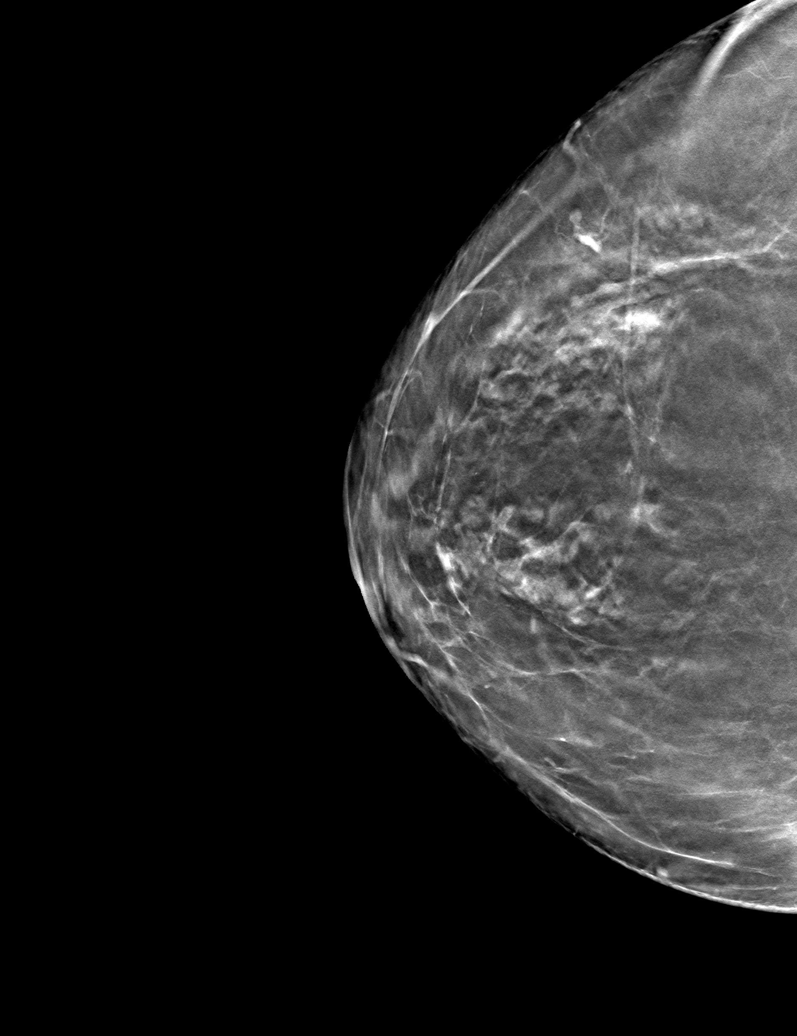

[6 of 30 positions shown; findings below may reference images not displayed]

ACR Breast Density Category b: There are scattered areas of
fibroglandular density.
FINDINGS: There are no findings suspicious for malignancy.
IMPRESSION: No mammographic evidence of malignancy. A result letter of this
screening mammogram will be mailed directly to the patient.

RECOMMENDATION:
Screening mammogram in one year. (Code:51-O-LD2)

BI-RADS CATEGORY  1: Negative.

## 2022-09-10 ENCOUNTER — Encounter: Payer: Self-pay | Admitting: Cardiology

## 2022-09-10 ENCOUNTER — Ambulatory Visit: Payer: Medicare Other | Admitting: Cardiology

## 2022-09-10 VITALS — BP 164/86 | HR 92 | Resp 16 | Ht 65.0 in | Wt 162.0 lb

## 2022-09-10 DIAGNOSIS — R0683 Snoring: Secondary | ICD-10-CM | POA: Diagnosis not present

## 2022-09-10 DIAGNOSIS — E782 Mixed hyperlipidemia: Secondary | ICD-10-CM | POA: Diagnosis not present

## 2022-09-10 DIAGNOSIS — I10 Essential (primary) hypertension: Secondary | ICD-10-CM

## 2022-09-10 DIAGNOSIS — R002 Palpitations: Secondary | ICD-10-CM | POA: Diagnosis not present

## 2022-09-10 MED ORDER — CARVEDILOL 3.125 MG PO TABS: 3.1250 mg | ORAL_TABLET | Freq: Two times a day (BID) | ORAL | 0 refills | Status: DC

## 2022-09-10 NOTE — Progress Notes (Signed)
ID:  SAVANHA ISLAND, DOB 22-Feb-1956, MRN [id number]  PCP:  Kathyrn Lass, MD  Cardiologist:  Rex Kras, DO, [facility] (established care 07/24/2022)  Date: 09/10/22 Last Office Visit: 07/24/2022  Chief Complaint  Patient presents with   Shortness of Breath   Follow-up   Results    Monitor and echo    HPI  Melinda Hodge is a 66 y.o. Caucasian female whose past medical history and cardiovascular risk factors include: Hyperlipidemia, hypertension, migraines, vitamin D deficiency, minimal coronary artery calcification (score 1, 55th percentile, 09/2019), former smoker.  Patient was referred to the practice for evaluation of shortness of breath, palpitations/fatigue.  At last office visit patient mentioned that she would have frequent episodes of feeling tired/fatigue throughout the day.  Most prominent was after taking care of the kids in the morning from 8-10 she would feel exhausted to the point that she would have to take a nap for her to feeel better.   Otherwise she denies anginal discomfort or heart failure symptoms.  At the last office visit the shared decision was to proceed with an echocardiogram along with cardiac monitor to evaluate for dysrhythmias.  She is noted to have preserved LVEF without any significant valvular heart disease and no significant dysrhythmias on the recent cardiac monitor.  Her tachycardia burden was 13% and her patient triggered events illustrated either sinus rhythm or sinus tachycardia with rare PVCs.  Her office blood pressures are elevated; however, home blood pressures are usually 140/90.  Since last office visit clinically she does feel better and she has made dietary and lifestyle changes.  FUNCTIONAL STATUS: Treadmill 5 days a week for 20-30 minutes.    ALLERGIES: Allergies  Allergen Reactions   Latex Itching and Rash    MEDICATION LIST PRIOR TO VISIT: Current Meds  Medication Sig   acyclovir (ZOVIRAX) 400 MG tablet Take 400 mg by mouth  every 8 (eight) hours.   ALPRAZolam (XANAX) 0.25 MG tablet Take 0.25 mg by mouth 2 (two) times daily as needed for anxiety.   atorvastatin (LIPITOR) 10 MG tablet Take 10 mg by mouth daily.   carvedilol (COREG) 3.125 MG tablet Take 1 tablet (3.125 mg total) by mouth 2 (two) times daily with a meal.   cetirizine (ZYRTEC) 10 MG tablet Take 10 mg by mouth daily.   Cholecalciferol (VITAMIN D3) 50 MCG (2000 UT) TABS Take 1 tablet by mouth daily at 12 noon.   doxycycline (VIBRAMYCIN) 50 MG capsule Take 50 mg by mouth daily.   famotidine (PEPCID) 20 MG tablet Take 20 mg by mouth 2 (two) times daily.   fluticasone (FLONASE ALLERGY RELIEF) 50 MCG/ACT nasal spray Spray 1 spray every day by intranasal route.   losartan (COZAAR) 50 MG tablet Take 50 mg by mouth daily.   progesterone (PROMETRIUM) 200 MG capsule Take 200 mg by mouth daily.   SUMAtriptan (IMITREX) 100 MG tablet Take 0.5 tablets by mouth daily as needed.   triamcinolone cream (KENALOG) 0.1 % Apply topically 2 (two) times daily.   venlafaxine XR (EFFEXOR-XR) 75 MG 24 hr capsule Take 75 mg by mouth 3 (three) times daily.   vitamin B-12 (CYANOCOBALAMIN) 500 MCG tablet Take 500 mcg by mouth daily.   zinc gluconate 50 MG tablet Take 50 mg by mouth daily.     PAST MEDICAL HISTORY: Past Medical History:  Diagnosis Date   Anxiety    Depression    Hyperlipidemia    Hypertension    controlled by diet and exercise  PAST SURGICAL HISTORY: Past Surgical History:  Procedure Laterality Date   BREAST EXCISIONAL BIOPSY Right    BUNIONECTOMY WITH HAMMERTOE RECONSTRUCTION Left 03/17/2018   Procedure: Left First Metatarsal Scarf Osteotomy and Modified McBride Buionectomy; Left Second Metatarsal Weil Osteotomy and Hammertoe Correction;  Surgeon: Wylene Simmer, MD;  Location: [place];  Service: Orthopedics;  Laterality: Left;   CESAREAN SECTION     times three    FAMILY HISTORY: The patient family history includes Hypertension in  her mother.  SOCIAL HISTORY:  The patient  reports that she quit smoking about 41 years ago. Her smoking use included cigarettes. She has a 2.00 pack-year smoking history. She has never used smokeless tobacco. She reports current alcohol use of about 1.0 standard drink of alcohol per week. She reports that she does not use drugs.  REVIEW OF SYSTEMS: Review of Systems  Constitutional: Positive for malaise/fatigue (Improved).  Cardiovascular:  Positive for palpitations (Improved). Negative for chest pain, claudication, dyspnea on exertion, irregular heartbeat, leg swelling, near-syncope, orthopnea, paroxysmal nocturnal dyspnea and syncope.  Respiratory:  Positive for snoring. Negative for shortness of breath.        Difficulty breathing  Hematologic/Lymphatic: Negative for bleeding problem.  Musculoskeletal:  Negative for muscle cramps and myalgias.  Neurological:  Negative for dizziness and light-headedness.   PHYSICAL EXAM:    09/10/2022    3:39 PM 09/10/2022    3:37 PM 07/24/2022   12:45 PM  Vitals with BMI  Height  _0  _1   Weight  162 lbs 163 lbs  BMI  23.53 61.44  Systolic 315 400 867  Diastolic 86 89 67  Pulse 92 95 87   Physical Exam  Constitutional: No distress.  Age appropriate, hemodynamically stable.   Neck: No JVD present.  Cardiovascular: Normal rate, regular rhythm, S1 normal, S2 normal, intact distal pulses and normal pulses. Exam reveals no gallop, no S3 and no S4.  No murmur heard. Pulmonary/Chest: Effort normal and breath sounds normal. No stridor. She has no wheezes. She has no rales.  Abdominal: Soft. Bowel sounds are normal. She exhibits no distension. There is no abdominal tenderness.  Musculoskeletal:        General: No edema.     Cervical back: Neck supple.  Neurological: She is alert and oriented to person, place, and time. She has intact cranial nerves (2-12).  Skin: Skin is warm and moist.   CARDIAC DATABASE: EKG: 07/24/2022: Normal sinus  rhythm, 78 bpm, normal axis, without underlying ischemia or injury pattern.  Echocardiogram: 08/12/2022:  Normal LV systolic function with visual EF 60-65%. Left ventricle cavity is normal in size. Normal left ventricular wall thickness. Normal global wall motion. Doppler evidence of grade I (impaired) diastolic dysfunction, normal LAP. Calculated EF 71%. Structurally normal tricuspid valve. Mild tricuspid regurgitation. No evidence of pulmonary hypertension. No prior available for comparison.    Stress Testing: No results found for this or any previous visit from the past 1095 days.  Heart Catheterization: None  Cardiac monitor (Zio Patch): 07/28/2022 - 08/04/2022 Dominant rhythm sinus, followed by tachycardia (burden 13%). Heart rate 61-152 bpm. Avg HR 90 bpm. No atrial fibrillation, ventricular tachycardia, high grade AV block, pauses (3 seconds or longer). Total ventricular ectopic burden <1%. Total supraventricular ectopic burden <1%. Rare episodes of supraventricular tachycardia. Patient triggered events: 6. Underlying rhythm either sinus or sinus tachycardia with rare PVCs in ventricular trigeminy pattern.   LABORATORY DATA: External Labs: Collected: 03/31/2022. A1c 5.6. TSH 2.88. BUN 14, creatinine  0.63. eGFR 97. Sodium 143, potassium 3.8, chloride 109, bicarb 28. AST 26, ALT 34, alkaline phosphatase 99. Hemoglobin 14.2 g/dL, hematocrit 42%. Total cholesterol 141, triglycerides 137, HDL 52, LDL 65, non-HDL 89  IMPRESSION:    ICD-10-CM   1. Palpitations  R00.2 carvedilol (COREG) 3.125 MG tablet    2. Benign hypertension  I10 carvedilol (COREG) 3.125 MG tablet    3. Mixed hyperlipidemia  E78.2     4. Snoring  R06.83        RECOMMENDATIONS: Melinda Hodge is a 66 y.o. Caucasian female whose past medical history and cardiac risk factors include: Hyperlipidemia, hypertension, migraines, vitamin D deficiency, minimal coronary artery calcification (score 1, 55th  percentile, 09/2019), former smoker.  Palpitations No identifiable reversible causes. Has reduced the dose of Imitrex to see if her symptoms would improve. Cardiac monitor results reviewed. Given the fact that her LVEF is visually, tachycardia burden of 13%, and patient triggered events noting sinus tachycardia along with rare PVCs the shared decision was to uptitrate medical therapy. Since her blood pressures are also not optimized recommend carvedilol 3.125 mg twice daily which would give her both alpha and beta blocking properties.  Medication profile discussed.  We will uptitrate medical therapy based on her blood pressure readings and symptoms.  Benign hypertension Office blood pressures are not well-controlled. Home blood pressures are better controlled but not at goal. Medication changes as discussed above. Re emphasized importance of low-salt diet. Long-term management per primary team.  Mixed hyperlipidemia Continue current statin therapy, .  Does not endorse myalgias. Currently managed by primary care provider.  Snoring Given her symptoms of feeling tired/fatigue, the need for taking frequent naps, diastolic dysfunction on echocardiogram, and other chronic comorbid conditions.  I refer you to sleep medicine at last office visit.  Patient is scheduled to have a sleep study in the near future for further evaluation.  I have asked the patient to call me back in 30 days to see how she is feeling from both the palpitation standpoint as well as blood pressure readings.  If she still symptomatic would recommend up titration of carvedilol; however, if she is stable I would like to see her back in 3 months sooner if needed.  FINAL MEDICATION LIST END OF ENCOUNTER: Meds ordered this encounter  Medications   carvedilol (COREG) 3.125 MG tablet    Sig: Take 1 tablet (3.125 mg total) by mouth 2 (two) times daily with a meal.    Dispense:  60 tablet    Refill:  0    There are no  discontinued medications.    Current Outpatient Medications:    acyclovir (ZOVIRAX) 400 MG tablet, Take 400 mg by mouth every 8 (eight) hours., Disp: , Rfl:    ALPRAZolam (XANAX) 0.25 MG tablet, Take 0.25 mg by mouth 2 (two) times daily as needed for anxiety., Disp: , Rfl:    atorvastatin (LIPITOR) 10 MG tablet, Take 10 mg by mouth daily., Disp: , Rfl:    carvedilol (COREG) 3.125 MG tablet, Take 1 tablet (3.125 mg total) by mouth 2 (two) times daily with a meal., Disp: 60 tablet, Rfl: 0   cetirizine (ZYRTEC) 10 MG tablet, Take 10 mg by mouth daily., Disp: , Rfl:    Cholecalciferol (VITAMIN D3) 50 MCG (2000 UT) TABS, Take 1 tablet by mouth daily at 12 noon., Disp: , Rfl:    doxycycline (VIBRAMYCIN) 50 MG capsule, Take 50 mg by mouth daily., Disp: , Rfl:    famotidine (PEPCID)  20 MG tablet, Take 20 mg by mouth 2 (two) times daily., Disp: , Rfl:    fluticasone (FLONASE ALLERGY RELIEF) 50 MCG/ACT nasal spray, Spray 1 spray every day by intranasal route., Disp: , Rfl:    losartan (COZAAR) 50 MG tablet, Take 50 mg by mouth daily., Disp: , Rfl:    progesterone (PROMETRIUM) 200 MG capsule, Take 200 mg by mouth daily., Disp: , Rfl:    SUMAtriptan (IMITREX) 100 MG tablet, Take 0.5 tablets by mouth daily as needed., Disp: , Rfl:    triamcinolone cream (KENALOG) 0.1 %, Apply topically 2 (two) times daily., Disp: , Rfl:    venlafaxine XR (EFFEXOR-XR) 75 MG 24 hr capsule, Take 75 mg by mouth 3 (three) times daily., Disp: , Rfl:    vitamin B-12 (CYANOCOBALAMIN) 500 MCG tablet, Take 500 mcg by mouth daily., Disp: , Rfl:    zinc gluconate 50 MG tablet, Take 50 mg by mouth daily., Disp: , Rfl:   No orders of the defined types were placed in this encounter.   There are no Patient Instructions on file for this visit.   --Continue cardiac medications as reconciled in final medication list. --Return in about 3 months (around 12/10/2022) for Follow up, Palpitations, Fatigue. or sooner if needed. --Continue  follow-up with your primary care physician regarding the management of your other chronic comorbid conditions.  Patient's questions and concerns were addressed to her satisfaction. She voices understanding of the instructions provided during this encounter.   This note was created using a voice recognition software as a result there may be grammatical errors inadvertently enclosed that do not reflect the nature of this encounter. Every attempt is made to correct such errors.  Rex Kras, [place], [facility]  Pager: [phone] Office: [phone]

## 2022-09-15 DIAGNOSIS — I10 Essential (primary) hypertension: Secondary | ICD-10-CM | POA: Diagnosis not present

## 2022-09-15 DIAGNOSIS — E663 Overweight: Secondary | ICD-10-CM | POA: Diagnosis not present

## 2022-09-15 DIAGNOSIS — Z6825 Body mass index (BMI) 25.0-25.9, adult: Secondary | ICD-10-CM | POA: Diagnosis not present

## 2022-09-23 DIAGNOSIS — L821 Other seborrheic keratosis: Secondary | ICD-10-CM | POA: Diagnosis not present

## 2022-09-23 DIAGNOSIS — L814 Other melanin hyperpigmentation: Secondary | ICD-10-CM | POA: Diagnosis not present

## 2022-09-23 DIAGNOSIS — L570 Actinic keratosis: Secondary | ICD-10-CM | POA: Diagnosis not present

## 2022-09-23 DIAGNOSIS — D1801 Hemangioma of skin and subcutaneous tissue: Secondary | ICD-10-CM | POA: Diagnosis not present

## 2022-09-23 DIAGNOSIS — D2271 Melanocytic nevi of right lower limb, including hip: Secondary | ICD-10-CM | POA: Diagnosis not present

## 2022-09-23 DIAGNOSIS — B079 Viral wart, unspecified: Secondary | ICD-10-CM | POA: Diagnosis not present

## 2022-10-03 ENCOUNTER — Other Ambulatory Visit: Payer: Self-pay | Admitting: Cardiology

## 2022-10-03 DIAGNOSIS — I10 Essential (primary) hypertension: Secondary | ICD-10-CM

## 2022-10-03 DIAGNOSIS — R002 Palpitations: Secondary | ICD-10-CM

## 2022-10-07 DIAGNOSIS — G4733 Obstructive sleep apnea (adult) (pediatric): Secondary | ICD-10-CM | POA: Diagnosis not present

## 2022-10-08 DIAGNOSIS — G4733 Obstructive sleep apnea (adult) (pediatric): Secondary | ICD-10-CM | POA: Diagnosis not present

## 2022-10-15 DIAGNOSIS — K080 Exfoliation of teeth due to systemic causes: Secondary | ICD-10-CM | POA: Diagnosis not present

## 2022-10-27 DIAGNOSIS — G43009 Migraine without aura, not intractable, without status migrainosus: Secondary | ICD-10-CM | POA: Diagnosis not present

## 2022-10-27 DIAGNOSIS — I10 Essential (primary) hypertension: Secondary | ICD-10-CM | POA: Diagnosis not present

## 2022-10-27 DIAGNOSIS — E663 Overweight: Secondary | ICD-10-CM | POA: Diagnosis not present

## 2022-10-28 DIAGNOSIS — U071 COVID-19: Secondary | ICD-10-CM | POA: Diagnosis not present

## 2022-10-31 ENCOUNTER — Other Ambulatory Visit: Payer: Self-pay | Admitting: Cardiology

## 2022-10-31 DIAGNOSIS — R002 Palpitations: Secondary | ICD-10-CM

## 2022-10-31 DIAGNOSIS — I10 Essential (primary) hypertension: Secondary | ICD-10-CM

## 2022-11-16 DIAGNOSIS — K080 Exfoliation of teeth due to systemic causes: Secondary | ICD-10-CM | POA: Diagnosis not present

## 2022-11-20 DIAGNOSIS — G4733 Obstructive sleep apnea (adult) (pediatric): Secondary | ICD-10-CM | POA: Diagnosis not present

## 2022-11-23 DIAGNOSIS — J40 Bronchitis, not specified as acute or chronic: Secondary | ICD-10-CM | POA: Diagnosis not present

## 2022-11-23 DIAGNOSIS — J019 Acute sinusitis, unspecified: Secondary | ICD-10-CM | POA: Diagnosis not present

## 2022-11-26 DIAGNOSIS — K080 Exfoliation of teeth due to systemic causes: Secondary | ICD-10-CM | POA: Diagnosis not present

## 2022-12-11 ENCOUNTER — Ambulatory Visit: Payer: Medicare Other | Admitting: Cardiology

## 2022-12-11 ENCOUNTER — Encounter: Payer: Self-pay | Admitting: Cardiology

## 2022-12-11 VITALS — BP 125/75 | HR 81 | Ht 65.0 in | Wt 157.0 lb

## 2022-12-11 DIAGNOSIS — I10 Essential (primary) hypertension: Secondary | ICD-10-CM

## 2022-12-11 DIAGNOSIS — G4730 Sleep apnea, unspecified: Secondary | ICD-10-CM | POA: Diagnosis not present

## 2022-12-11 DIAGNOSIS — E782 Mixed hyperlipidemia: Secondary | ICD-10-CM

## 2022-12-11 DIAGNOSIS — R002 Palpitations: Secondary | ICD-10-CM | POA: Diagnosis not present

## 2022-12-11 MED ORDER — CARVEDILOL 3.125 MG PO TABS: 3.1250 mg | ORAL_TABLET | Freq: Two times a day (BID) | ORAL | 1 refills | Status: DC

## 2022-12-11 NOTE — Progress Notes (Signed)
ID:  ROZALYN SANTANNA, DOB 26-Jul-1956, MRN [id number]  PCP:  Kathyrn Lass, MD  Cardiologist:  Rex Kras, DO, [facility] (established care 07/24/2022)  Date: 12/11/22 Last Office Visit: 09/10/2022  Chief Complaint  Patient presents with   Fatigue   Follow-up    3 months    HPI  Melinda Hodge is a 67 y.o. Caucasian female whose past medical history and cardiovascular risk factors include: Sleep apnea, hyperlipidemia, hypertension, migraines, vitamin D deficiency, minimal coronary artery calcification (score 1, 55th percentile, 09/2019), former smoker.  She was referred to the practice for shortness of breath and palpitations/fatigue.  She underwent cardiovascular workup which included a Zio patch which noted tachycardia burden of approximately 13%, patient triggered event also illustrated sinus rhythm and sinus tachycardia with rare PVCs.  She was started on carvedilol at last office visit and referred to sleep medicine as well.  She presents today for follow-up.  Since last office visit she is feeling well from a cardiovascular standpoint.  She is less tired and fatigue.  She did see [facility] and was diagnosed with sleep apnea and started CPAP therapy.  Unfortunately, since last visit she has had COVID infection and sinus which she is recovering well.  FUNCTIONAL STATUS: Treadmill 5 days a week for 20-30 minutes.    ALLERGIES: Allergies  Allergen Reactions   Latex Itching, Rash and Other (See Comments)    MEDICATION LIST PRIOR TO VISIT: Current Meds  Medication Sig   acyclovir (ZOVIRAX) 400 MG tablet Take 400 mg by mouth every 8 (eight) hours.   ALPRAZolam (XANAX) 0.25 MG tablet Take 0.25 mg by mouth 2 (two) times daily as needed for anxiety.   atorvastatin (LIPITOR) 10 MG tablet Take 10 mg by mouth daily.   cetirizine (ZYRTEC) 10 MG tablet Take 10 mg by mouth daily.   Cholecalciferol (VITAMIN D3) 50 MCG (2000 UT) TABS Take 1 tablet by mouth daily at 12 noon.   doxycycline  (VIBRAMYCIN) 50 MG capsule Take 50 mg by mouth daily.   famotidine (PEPCID) 20 MG tablet Take 20 mg by mouth 2 (two) times daily.   fluticasone (FLONASE ALLERGY RELIEF) 50 MCG/ACT nasal spray Spray 1 spray every day by intranasal route.   losartan (COZAAR) 50 MG tablet Take 50 mg by mouth daily.   progesterone (PROMETRIUM) 200 MG capsule Take 200 mg by mouth daily.   SUMAtriptan (IMITREX) 100 MG tablet Take 0.5 tablets by mouth daily as needed.   venlafaxine XR (EFFEXOR-XR) 75 MG 24 hr capsule Take 75 mg by mouth daily with breakfast.   vitamin B-12 (CYANOCOBALAMIN) 500 MCG tablet Take 500 mcg by mouth daily.   zinc gluconate 50 MG tablet Take 50 mg by mouth daily.   [DISCONTINUED] carvedilol (COREG) 3.125 MG tablet TAKE 1 TABLET BY MOUTH TWICE DAILY WITH A MEAL     PAST MEDICAL HISTORY: Past Medical History:  Diagnosis Date   Anxiety    Depression    Hyperlipidemia    Hypertension    controlled by diet and exercise   Sleep apnea     PAST SURGICAL HISTORY: Past Surgical History:  Procedure Laterality Date   BREAST EXCISIONAL BIOPSY Right    BUNIONECTOMY WITH HAMMERTOE RECONSTRUCTION Left 03/17/2018   Procedure: Left First Metatarsal Scarf Osteotomy and Modified McBride Buionectomy; Left Second Metatarsal Weil Osteotomy and Hammertoe Correction;  Surgeon: Wylene Simmer, MD;  Location: [place];  Service: Orthopedics;  Laterality: Left;   CESAREAN SECTION  times three    FAMILY HISTORY: The patient family history includes Hypertension in her mother.  SOCIAL HISTORY:  The patient  reports that she quit smoking about 42 years ago. Her smoking use included cigarettes. She has a 2.00 pack-year smoking history. She has never used smokeless tobacco. She reports current alcohol use of about 1.0 standard drink of alcohol per week. She reports that she does not use drugs.  REVIEW OF SYSTEMS: Review of Systems  Cardiovascular:  Positive for palpitations (much improved).  Negative for chest pain, claudication, dyspnea on exertion, irregular heartbeat, leg swelling, near-syncope, orthopnea, paroxysmal nocturnal dyspnea and syncope.  Respiratory:  Positive for snoring (started CPAP). Negative for shortness of breath.   Hematologic/Lymphatic: Negative for bleeding problem.  Musculoskeletal:  Negative for muscle cramps and myalgias.  Neurological:  Negative for dizziness and light-headedness.   PHYSICAL EXAM:    12/11/2022    1:38 PM 09/10/2022    3:39 PM 09/10/2022    3:37 PM  Vitals with BMI  Height '5\' 5"'$   '5\' 5"'$   Weight 157 lbs  162 lbs  BMI 123456  [id number]  Systolic 0000000 123456 123456  Diastolic 75 86 89  Pulse 81 92 95   Physical Exam  Constitutional: No distress.  Age appropriate, hemodynamically stable.   Neck: No JVD present.  Cardiovascular: Normal rate, regular rhythm, S1 normal, S2 normal, intact distal pulses and normal pulses. Exam reveals no gallop, no S3 and no S4.  No murmur heard. Pulmonary/Chest: Effort normal and breath sounds normal. No stridor. She has no wheezes. She has no rales.  Abdominal: Soft. Bowel sounds are normal. She exhibits no distension. There is no abdominal tenderness.  Musculoskeletal:        General: No edema.     Cervical back: Neck supple.  Neurological: She is alert and oriented to person, place, and time. She has intact cranial nerves (2-12).  Skin: Skin is warm and moist.   CARDIAC DATABASE: EKG: 07/24/2022: Normal sinus rhythm, 78 bpm, normal axis, without underlying ischemia or injury pattern.  Echocardiogram: 08/12/2022:  Normal LV systolic function with visual EF 60-65%. Left ventricle cavity is normal in size. Normal left ventricular wall thickness. Normal global wall motion. Doppler evidence of grade I (impaired) diastolic dysfunction, normal LAP. Calculated EF 71%. Structurally normal tricuspid valve. Mild tricuspid regurgitation. No evidence of pulmonary hypertension. No prior available for  comparison.  Stress Testing: No results found for this or any previous visit from the past 1095 days.  Heart Catheterization: None  Cardiac monitor (Zio Patch): 07/28/2022 - 08/04/2022 Dominant rhythm sinus, followed by tachycardia (burden 13%). Heart rate 61-152 bpm. Avg HR 90 bpm. No atrial fibrillation, ventricular tachycardia, high grade AV block, pauses (3 seconds or longer). Total ventricular ectopic burden <1%. Total supraventricular ectopic burden <1%. Rare episodes of supraventricular tachycardia. Patient triggered events: 6. Underlying rhythm either sinus or sinus tachycardia with rare PVCs in ventricular trigeminy pattern.   LABORATORY DATA: External Labs: Collected: 03/31/2022. A1c 5.6. TSH 2.88. BUN 14, creatinine 0.63. eGFR 97. Sodium 143, potassium 3.8, chloride 109, bicarb 28. AST 26, ALT 34, alkaline phosphatase 99. Hemoglobin 14.2 g/dL, hematocrit 42%. Total cholesterol 141, triglycerides 137, HDL 52, LDL 65, non-HDL 89  IMPRESSION:    ICD-10-CM   1. Palpitations  R00.2 carvedilol (COREG) 3.125 MG tablet    2. Benign hypertension  I10 carvedilol (COREG) 3.125 MG tablet    3. Mixed hyperlipidemia  E78.2     4. Sleep apnea in adult  G47.30        RECOMMENDATIONS: Melinda Hodge is a 67 y.o. Caucasian female whose past medical history and cardiac risk factors include: Sleep apnea, hyperlipidemia, hypertension, migraines, vitamin D deficiency, minimal coronary artery calcification (score 1, 55th percentile, 09/2019), former smoker.  Referred to the practice for evaluation of shortness of breath, palpitations/fatigue.  She underwent cardiac workup including a Zio patch which noted underlying rhythm to be sinus with a tachycardia burden of 13%, echocardiogram did not illustrate any significant valvular heart disease.  With regards to his shortness of breath and uncontrolled hypertension she was started on carvedilol to help with both blood pressure and  tachycardia.  She responded well to the medication and both blood pressures and palpitations have improved significantly since last office visit.  In addition, given her symptoms of fatigue, feeling tired, falling asleep easily.  She was referred to sleep medicine and has been diagnosed with sleep apnea and currently on CPAP therapy.  Clinically she has improved significantly since the last office encounter.  She is thankful for the interventions and the recommendations provided.  Refilled carvedilol.  Follow-up in 6 months sooner if needed.  FINAL MEDICATION LIST END OF ENCOUNTER: Meds ordered this encounter  Medications   carvedilol (COREG) 3.125 MG tablet    Sig: Take 1 tablet (3.125 mg total) by mouth 2 (two) times daily with a meal.    Dispense:  180 tablet    Refill:  1    Medications Discontinued During This Encounter  Medication Reason   triamcinolone cream (KENALOG) 0.1 % Patient Preference   carvedilol (COREG) 3.125 MG tablet Reorder      Current Outpatient Medications:    acyclovir (ZOVIRAX) 400 MG tablet, Take 400 mg by mouth every 8 (eight) hours., Disp: , Rfl:    ALPRAZolam (XANAX) 0.25 MG tablet, Take 0.25 mg by mouth 2 (two) times daily as needed for anxiety., Disp: , Rfl:    atorvastatin (LIPITOR) 10 MG tablet, Take 10 mg by mouth daily., Disp: , Rfl:    cetirizine (ZYRTEC) 10 MG tablet, Take 10 mg by mouth daily., Disp: , Rfl:    Cholecalciferol (VITAMIN D3) 50 MCG (2000 UT) TABS, Take 1 tablet by mouth daily at 12 noon., Disp: , Rfl:    doxycycline (VIBRAMYCIN) 50 MG capsule, Take 50 mg by mouth daily., Disp: , Rfl:    famotidine (PEPCID) 20 MG tablet, Take 20 mg by mouth 2 (two) times daily., Disp: , Rfl:    fluticasone (FLONASE ALLERGY RELIEF) 50 MCG/ACT nasal spray, Spray 1 spray every day by intranasal route., Disp: , Rfl:    losartan (COZAAR) 50 MG tablet, Take 50 mg by mouth daily., Disp: , Rfl:    progesterone (PROMETRIUM) 200 MG capsule, Take 200 mg by  mouth daily., Disp: , Rfl:    SUMAtriptan (IMITREX) 100 MG tablet, Take 0.5 tablets by mouth daily as needed., Disp: , Rfl:    venlafaxine XR (EFFEXOR-XR) 75 MG 24 hr capsule, Take 75 mg by mouth daily with breakfast., Disp: , Rfl:    vitamin B-12 (CYANOCOBALAMIN) 500 MCG tablet, Take 500 mcg by mouth daily., Disp: , Rfl:    zinc gluconate 50 MG tablet, Take 50 mg by mouth daily., Disp: , Rfl:    carvedilol (COREG) 3.125 MG tablet, Take 1 tablet (3.125 mg total) by mouth 2 (two) times daily with a meal., Disp: 180 tablet, Rfl: 1  No orders of the defined types were placed in this encounter.  There are no Patient Instructions on file for this visit.   --Continue cardiac medications as reconciled in final medication list. --Return in about 6 months (around 06/13/2023) for palpitations, tachycardia, and BP. or sooner if needed. --Continue follow-up with your primary care physician regarding the management of your other chronic comorbid conditions.  Patient's questions and concerns were addressed to her satisfaction. She voices understanding of the instructions provided during this encounter.   This note was created using a voice recognition software as a result there may be grammatical errors inadvertently enclosed that do not reflect the nature of this encounter. Every attempt is made to correct such errors.  Rex Kras, [place], [facility]  Pager:  [phone] Office: [phone]

## 2022-12-15 DIAGNOSIS — G43009 Migraine without aura, not intractable, without status migrainosus: Secondary | ICD-10-CM | POA: Diagnosis not present

## 2022-12-15 DIAGNOSIS — I10 Essential (primary) hypertension: Secondary | ICD-10-CM | POA: Diagnosis not present

## 2022-12-19 DIAGNOSIS — G4733 Obstructive sleep apnea (adult) (pediatric): Secondary | ICD-10-CM | POA: Diagnosis not present

## 2022-12-23 DIAGNOSIS — G4733 Obstructive sleep apnea (adult) (pediatric): Secondary | ICD-10-CM | POA: Diagnosis not present

## 2023-01-07 DIAGNOSIS — G4733 Obstructive sleep apnea (adult) (pediatric): Secondary | ICD-10-CM | POA: Diagnosis not present

## 2023-01-19 DIAGNOSIS — G4733 Obstructive sleep apnea (adult) (pediatric): Secondary | ICD-10-CM | POA: Diagnosis not present

## 2023-02-02 DIAGNOSIS — J029 Acute pharyngitis, unspecified: Secondary | ICD-10-CM | POA: Diagnosis not present

## 2023-02-02 DIAGNOSIS — J0191 Acute recurrent sinusitis, unspecified: Secondary | ICD-10-CM | POA: Diagnosis not present

## 2023-02-02 DIAGNOSIS — J309 Allergic rhinitis, unspecified: Secondary | ICD-10-CM | POA: Diagnosis not present

## 2023-02-02 DIAGNOSIS — R051 Acute cough: Secondary | ICD-10-CM | POA: Diagnosis not present

## 2023-02-18 DIAGNOSIS — G4733 Obstructive sleep apnea (adult) (pediatric): Secondary | ICD-10-CM | POA: Diagnosis not present

## 2023-03-15 DIAGNOSIS — F514 Sleep terrors [night terrors]: Secondary | ICD-10-CM | POA: Diagnosis not present

## 2023-03-15 DIAGNOSIS — G4733 Obstructive sleep apnea (adult) (pediatric): Secondary | ICD-10-CM | POA: Diagnosis not present

## 2023-03-23 DIAGNOSIS — G4733 Obstructive sleep apnea (adult) (pediatric): Secondary | ICD-10-CM | POA: Diagnosis not present

## 2023-04-06 DIAGNOSIS — L820 Inflamed seborrheic keratosis: Secondary | ICD-10-CM | POA: Diagnosis not present

## 2023-04-06 DIAGNOSIS — L821 Other seborrheic keratosis: Secondary | ICD-10-CM | POA: Diagnosis not present

## 2023-04-06 DIAGNOSIS — D2271 Melanocytic nevi of right lower limb, including hip: Secondary | ICD-10-CM | POA: Diagnosis not present

## 2023-04-06 DIAGNOSIS — D692 Other nonthrombocytopenic purpura: Secondary | ICD-10-CM | POA: Diagnosis not present

## 2023-04-06 DIAGNOSIS — H524 Presbyopia: Secondary | ICD-10-CM | POA: Diagnosis not present

## 2023-04-06 DIAGNOSIS — L570 Actinic keratosis: Secondary | ICD-10-CM | POA: Diagnosis not present

## 2023-04-06 DIAGNOSIS — L814 Other melanin hyperpigmentation: Secondary | ICD-10-CM | POA: Diagnosis not present

## 2023-04-12 DIAGNOSIS — Z5181 Encounter for therapeutic drug level monitoring: Secondary | ICD-10-CM | POA: Diagnosis not present

## 2023-04-12 DIAGNOSIS — I471 Supraventricular tachycardia, unspecified: Secondary | ICD-10-CM | POA: Diagnosis not present

## 2023-04-12 DIAGNOSIS — E7800 Pure hypercholesterolemia, unspecified: Secondary | ICD-10-CM | POA: Diagnosis not present

## 2023-04-12 DIAGNOSIS — G4733 Obstructive sleep apnea (adult) (pediatric): Secondary | ICD-10-CM | POA: Diagnosis not present

## 2023-04-12 DIAGNOSIS — Z0000 Encounter for general adult medical examination without abnormal findings: Secondary | ICD-10-CM | POA: Diagnosis not present

## 2023-04-12 DIAGNOSIS — I10 Essential (primary) hypertension: Secondary | ICD-10-CM | POA: Diagnosis not present

## 2023-04-13 ENCOUNTER — Other Ambulatory Visit: Payer: Self-pay | Admitting: Family Medicine

## 2023-04-13 DIAGNOSIS — Z1231 Encounter for screening mammogram for malignant neoplasm of breast: Secondary | ICD-10-CM

## 2023-04-26 DIAGNOSIS — F514 Sleep terrors [night terrors]: Secondary | ICD-10-CM | POA: Diagnosis not present

## 2023-04-26 DIAGNOSIS — G4733 Obstructive sleep apnea (adult) (pediatric): Secondary | ICD-10-CM | POA: Diagnosis not present

## 2023-04-28 DIAGNOSIS — G4733 Obstructive sleep apnea (adult) (pediatric): Secondary | ICD-10-CM | POA: Diagnosis not present

## 2023-05-03 DIAGNOSIS — M816 Localized osteoporosis [Lequesne]: Secondary | ICD-10-CM | POA: Diagnosis not present

## 2023-05-03 DIAGNOSIS — Z124 Encounter for screening for malignant neoplasm of cervix: Secondary | ICD-10-CM | POA: Diagnosis not present

## 2023-05-03 DIAGNOSIS — Z6826 Body mass index (BMI) 26.0-26.9, adult: Secondary | ICD-10-CM | POA: Diagnosis not present

## 2023-05-03 DIAGNOSIS — Z01419 Encounter for gynecological examination (general) (routine) without abnormal findings: Secondary | ICD-10-CM | POA: Diagnosis not present

## 2023-05-19 DIAGNOSIS — J069 Acute upper respiratory infection, unspecified: Secondary | ICD-10-CM | POA: Diagnosis not present

## 2023-05-28 ENCOUNTER — Ambulatory Visit
Admission: RE | Admit: 2023-05-28 | Discharge: 2023-05-28 | Disposition: A | Discharge status: Home / Self Care | Payer: Medicare Other | Source: Ambulatory Visit | Attending: Family Medicine | Admitting: Family Medicine

## 2023-05-28 DIAGNOSIS — Z1231 Encounter for screening mammogram for malignant neoplasm of breast: Secondary | ICD-10-CM

## 2023-06-05 ENCOUNTER — Other Ambulatory Visit: Payer: Self-pay | Admitting: Cardiology

## 2023-06-05 DIAGNOSIS — R002 Palpitations: Secondary | ICD-10-CM

## 2023-06-05 DIAGNOSIS — I10 Essential (primary) hypertension: Secondary | ICD-10-CM

## 2023-06-14 ENCOUNTER — Encounter: Payer: Self-pay | Admitting: Cardiology

## 2023-06-14 ENCOUNTER — Ambulatory Visit: Payer: Medicare Other | Admitting: Cardiology

## 2023-06-14 VITALS — BP 132/80 | HR 71 | Resp 16 | Ht 65.0 in | Wt 161.0 lb

## 2023-06-14 DIAGNOSIS — R002 Palpitations: Secondary | ICD-10-CM | POA: Diagnosis not present

## 2023-06-14 DIAGNOSIS — I10 Essential (primary) hypertension: Secondary | ICD-10-CM

## 2023-06-14 DIAGNOSIS — E782 Mixed hyperlipidemia: Secondary | ICD-10-CM

## 2023-06-14 DIAGNOSIS — G4730 Sleep apnea, unspecified: Secondary | ICD-10-CM

## 2023-06-14 MED ORDER — CARVEDILOL 3.125 MG PO TABS: 3.1250 mg | ORAL_TABLET | Freq: Two times a day (BID) | ORAL | 1 refills | Status: DC

## 2023-06-14 NOTE — Progress Notes (Signed)
ID:  Melinda Hodge, DOB 1955-11-25, MRN [id number]  PCP:  Sigmund Hazel, MD  Cardiologist:  Tessa Lerner, DO, [facility] (established care 07/24/2022)  Date: 06/14/23 Last Office Visit: 12/11/2022  Chief Complaint  Patient presents with   Follow-up    6 months follow-up for palpitations/tachycardia.    HPI  Melinda Hodge is a 67 y.o. Caucasian female whose past medical history and cardiovascular risk factors include: Sleep apnea on CPAP, hyperlipidemia, hypertension, migraines, vitamin D deficiency, minimal coronary artery calcification (score 1, 55th percentile, 09/2019), former smoker.  She was referred to the practice for shortness of breath and palpitations/fatigue.  She underwent cardiovascular workup which included a Zio patch which noted tachycardia burden of approximately 13%, patient triggered event also illustrated sinus rhythm and sinus tachycardia with rare PVCs.  Since then has been started on pharmacological therapy and also been referred to sleep medicine and was diagnosed with sleep apnea and currently on CPAP.  Since last office visit, denies anginal chest pain or heart failure symptoms.   FUNCTIONAL STATUS: Treadmill 5 days a week for 20-30 minutes.    ALLERGIES: Allergies  Allergen Reactions   Latex Itching, Rash and Other (See Comments)    MEDICATION LIST PRIOR TO VISIT: Current Meds  Medication Sig   acyclovir (ZOVIRAX) 400 MG tablet Take 400 mg by mouth every 8 (eight) hours.   alendronate (FOSAMAX) 5 MG tablet Take 5 mg by mouth every 7 (seven) weeks. Take with a full glass of water on an empty stomach.   ALPRAZolam (XANAX) 0.25 MG tablet Take 0.25 mg by mouth 2 (two) times daily as needed for anxiety.   atorvastatin (LIPITOR) 10 MG tablet Take 10 mg by mouth daily.   cetirizine (ZYRTEC) 10 MG tablet Take 10 mg by mouth daily.   Cholecalciferol (VITAMIN D3) 50 MCG (2000 UT) TABS Take 1 tablet by mouth daily at 12 noon.   famotidine (PEPCID) 20 MG tablet Take  20 mg by mouth 2 (two) times daily.   fluticasone (FLONASE ALLERGY RELIEF) 50 MCG/ACT nasal spray Spray 1 spray every day by intranasal route.   losartan (COZAAR) 50 MG tablet Take 50 mg by mouth daily.   progesterone (PROMETRIUM) 200 MG capsule Take 200 mg by mouth daily.   SUMAtriptan (IMITREX) 100 MG tablet Take 0.5 tablets by mouth daily as needed.   venlafaxine XR (EFFEXOR-XR) 75 MG 24 hr capsule Take 75 mg by mouth daily with breakfast.   vitamin B-12 (CYANOCOBALAMIN) 500 MCG tablet Take 500 mcg by mouth daily.   zinc gluconate 50 MG tablet Take 50 mg by mouth daily.   [DISCONTINUED] carvedilol (COREG) 3.125 MG tablet TAKE 1 TABLET BY MOUTH TWICE DAILY WITH MEALS     PAST MEDICAL HISTORY: Past Medical History:  Diagnosis Date   Anxiety    Depression    Hyperlipidemia    Hypertension    controlled by diet and exercise   Sleep apnea     PAST SURGICAL HISTORY: Past Surgical History:  Procedure Laterality Date   BREAST EXCISIONAL BIOPSY Right    BUNIONECTOMY WITH HAMMERTOE RECONSTRUCTION Left 03/17/2018   Procedure: Left First Metatarsal Scarf Osteotomy and Modified McBride Buionectomy; Left Second Metatarsal Weil Osteotomy and Hammertoe Correction;  Surgeon: Toni Arthurs, MD;  Location: [facility];  Service: Orthopedics;  Laterality: Left;   CESAREAN SECTION     times three    FAMILY HISTORY: The patient family history includes Hypertension in her mother.  SOCIAL HISTORY:  The  patient  reports that she quit smoking about 42 years ago. Her smoking use included cigarettes. She started smoking about 44 years ago. She has a 2 pack-year smoking history. She has never used smokeless tobacco. She reports current alcohol use of about 1.0 standard drink of alcohol per week. She reports that she does not use drugs.  REVIEW OF SYSTEMS: Review of Systems  Cardiovascular:  Negative for chest pain, claudication, dyspnea on exertion, irregular heartbeat, leg swelling,  near-syncope, orthopnea, palpitations, paroxysmal nocturnal dyspnea and syncope.  Respiratory:  Negative for shortness of breath.   Hematologic/Lymphatic: Negative for bleeding problem.  Musculoskeletal:  Negative for muscle cramps and myalgias.  Neurological:  Negative for dizziness and light-headedness.   PHYSICAL EXAM:    06/14/2023   11:50 AM 12/11/2022    1:38 PM 09/10/2022    3:39 PM  Vitals with BMI  Height 5\' 5"  5\' 5"    Weight 161 lbs 157 lbs   BMI 26.79 26.13   Systolic 132 125 295  Diastolic 80 75 86  Pulse 71 81 92   Physical Exam  Constitutional: No distress.  Age appropriate, hemodynamically stable.   Neck: No JVD present.  Cardiovascular: Normal rate, regular rhythm, S1 normal, S2 normal, intact distal pulses and normal pulses. Exam reveals no gallop, no S3 and no S4.  No murmur heard. Pulmonary/Chest: Effort normal and breath sounds normal. No stridor. She has no wheezes. She has no rales.  Abdominal: Soft. Bowel sounds are normal. She exhibits no distension. There is no abdominal tenderness.  Musculoskeletal:        General: No edema.     Cervical back: Neck supple.  Neurological: She is alert and oriented to person, place, and time. She has intact cranial nerves (2-12).  Skin: Skin is warm and moist.   CARDIAC DATABASE: EKG: 07/24/2022: Normal sinus rhythm, 78 bpm, normal axis, without underlying ischemia or injury pattern. 06/14/2023: Sinus rhythm, 70 bpm, normal axis, without underlying ischemia or injury pattern.  Echocardiogram: 08/12/2022:  Normal LV systolic function with visual EF 60-65%. Left ventricle cavity is normal in size. Normal left ventricular wall thickness. Normal global wall motion. Doppler evidence of grade I (impaired) diastolic dysfunction, normal LAP.  Mild tricuspid regurgitation. No evidence of pulmonary hypertension. No prior available for comparison.  Stress Testing: No results found for this or any previous visit from the past 1095  days.  Heart Catheterization: None  Cardiac monitor (Zio Patch): 07/28/2022 - 08/04/2022 Dominant rhythm sinus, followed by tachycardia (burden 13%). Heart rate 61-152 bpm. Avg HR 90 bpm. No atrial fibrillation, ventricular tachycardia, high grade AV block, pauses (3 seconds or longer). Total ventricular ectopic burden <1%. Total supraventricular ectopic burden <1%. Rare episodes of supraventricular tachycardia. Patient triggered events: 6. Underlying rhythm either sinus or sinus tachycardia with rare PVCs in ventricular trigeminy pattern.   LABORATORY DATA: External Labs: Collected: 03/31/2022. A1c 5.6. TSH 2.88. BUN 14, creatinine 0.63. eGFR 97. Sodium 143, potassium 3.8, chloride 109, bicarb 28. AST 26, ALT 34, alkaline phosphatase 99. Hemoglobin 14.2 g/dL, hematocrit 28%. Total cholesterol 141, triglycerides 137, HDL 52, LDL 65, non-HDL 89  External Labs: Collected: April 16, 2023 available in Care Everywhere performed at PCP. BUN 15, creatinine 0.67. Sodium 142, potassium 3.9, chloride 108, bicarb 26. AST 18, ALT 20, alkaline phosphatase 94. Total cholesterol 134, triglycerides 119, HDL 48, LDL calculated 65, non-HDL 86  IMPRESSION:    ICD-10-CM   1. Palpitations  R00.2 EKG 12-Lead    carvedilol (COREG) 3.125  MG tablet    2. Benign hypertension  I10 carvedilol (COREG) 3.125 MG tablet    3. Mixed hyperlipidemia  E78.2     4. Sleep apnea in adult  G47.30        RECOMMENDATIONS: Melinda Hodge is a 67 y.o. Caucasian female whose past medical history and cardiac risk factors include: Sleep apnea on CPAP, hyperlipidemia, hypertension, migraines, vitamin D deficiency, minimal coronary artery calcification (score 1, 55th percentile, 09/2019), former smoker.  Initially referred to the practice for shortness of breath, palpitations/fatigue.  She has undergone appropriate cardiovascular workup and Zio patch illustrated underlying rhythm to be sinus with a tachycardia  burden of about 13% and echocardiogram noted no significant valvular heart disease.  Due to her symptoms the shared decision was to start her on a low-dose carvedilol which has helped her symptoms of palpitations and blood pressure is not significantly.  She presents today for 73-month follow-up visit.  She is doing well on current medical therapy and denies anginal chest pain or heart failure symptoms.  Medications refilled.  She had labs with PCP in July 2024 which notes LDL to be stable when compared to 2023.  Triglycerides and total cholesterol have improved on current therapy.  I would like to see her back on annual basis sooner if needed  FINAL MEDICATION LIST END OF ENCOUNTER: Meds ordered this encounter  Medications   carvedilol (COREG) 3.125 MG tablet    Sig: Take 1 tablet (3.125 mg total) by mouth 2 (two) times daily with a meal.    Dispense:  90 tablet    Refill:  1    Medications Discontinued During This Encounter  Medication Reason   doxycycline (VIBRAMYCIN) 50 MG capsule Completed Course   carvedilol (COREG) 3.125 MG tablet Reorder      Current Outpatient Medications:    acyclovir (ZOVIRAX) 400 MG tablet, Take 400 mg by mouth every 8 (eight) hours., Disp: , Rfl:    alendronate (FOSAMAX) 5 MG tablet, Take 5 mg by mouth every 7 (seven) weeks. Take with a full glass of water on an empty stomach., Disp: , Rfl:    ALPRAZolam (XANAX) 0.25 MG tablet, Take 0.25 mg by mouth 2 (two) times daily as needed for anxiety., Disp: , Rfl:    atorvastatin (LIPITOR) 10 MG tablet, Take 10 mg by mouth daily., Disp: , Rfl:    cetirizine (ZYRTEC) 10 MG tablet, Take 10 mg by mouth daily., Disp: , Rfl:    Cholecalciferol (VITAMIN D3) 50 MCG (2000 UT) TABS, Take 1 tablet by mouth daily at 12 noon., Disp: , Rfl:    famotidine (PEPCID) 20 MG tablet, Take 20 mg by mouth 2 (two) times daily., Disp: , Rfl:    fluticasone (FLONASE ALLERGY RELIEF) 50 MCG/ACT nasal spray, Spray 1 spray every day by  intranasal route., Disp: , Rfl:    losartan (COZAAR) 50 MG tablet, Take 50 mg by mouth daily., Disp: , Rfl:    progesterone (PROMETRIUM) 200 MG capsule, Take 200 mg by mouth daily., Disp: , Rfl:    SUMAtriptan (IMITREX) 100 MG tablet, Take 0.5 tablets by mouth daily as needed., Disp: , Rfl:    venlafaxine XR (EFFEXOR-XR) 75 MG 24 hr capsule, Take 75 mg by mouth daily with breakfast., Disp: , Rfl:    vitamin B-12 (CYANOCOBALAMIN) 500 MCG tablet, Take 500 mcg by mouth daily., Disp: , Rfl:    zinc gluconate 50 MG tablet, Take 50 mg by mouth daily., Disp: , Rfl:  carvedilol (COREG) 3.125 MG tablet, Take 1 tablet (3.125 mg total) by mouth 2 (two) times daily with a meal., Disp: 90 tablet, Rfl: 1  Orders Placed This Encounter  Procedures   EKG 12-Lead    There are no Patient Instructions on file for this visit.   --Continue cardiac medications as reconciled in final medication list. --Return in about 1 year (around 06/13/2024) for Annual follow up visit, Palpitations. or sooner if needed. --Continue follow-up with your primary care physician regarding the management of your other chronic comorbid conditions.  Patient's questions and concerns were addressed to her satisfaction. She voices understanding of the instructions provided during this encounter.   This note was created using a voice recognition software as a result there may be grammatical errors inadvertently enclosed that do not reflect the nature of this encounter. Every attempt is made to correct such errors.  Tessa Lerner, [place], [facility]  Pager:  [phone] Office: [phone]

## 2023-07-08 DIAGNOSIS — Z23 Encounter for immunization: Secondary | ICD-10-CM | POA: Diagnosis not present

## 2023-10-13 DIAGNOSIS — G4733 Obstructive sleep apnea (adult) (pediatric): Secondary | ICD-10-CM | POA: Diagnosis not present

## 2023-10-14 DIAGNOSIS — K080 Exfoliation of teeth due to systemic causes: Secondary | ICD-10-CM | POA: Diagnosis not present

## 2023-11-02 DIAGNOSIS — G4733 Obstructive sleep apnea (adult) (pediatric): Secondary | ICD-10-CM | POA: Diagnosis not present

## 2023-11-15 DIAGNOSIS — D045 Carcinoma in situ of skin of trunk: Secondary | ICD-10-CM | POA: Diagnosis not present

## 2023-11-15 DIAGNOSIS — L821 Other seborrheic keratosis: Secondary | ICD-10-CM | POA: Diagnosis not present

## 2023-11-15 DIAGNOSIS — L570 Actinic keratosis: Secondary | ICD-10-CM | POA: Diagnosis not present

## 2023-11-15 DIAGNOSIS — L718 Other rosacea: Secondary | ICD-10-CM | POA: Diagnosis not present

## 2023-11-15 DIAGNOSIS — L814 Other melanin hyperpigmentation: Secondary | ICD-10-CM | POA: Diagnosis not present

## 2023-11-23 DIAGNOSIS — D069 Carcinoma in situ of cervix, unspecified: Secondary | ICD-10-CM | POA: Diagnosis not present

## 2023-11-23 DIAGNOSIS — D281 Benign neoplasm of vagina: Secondary | ICD-10-CM | POA: Diagnosis not present

## 2023-11-25 DIAGNOSIS — D045 Carcinoma in situ of skin of trunk: Secondary | ICD-10-CM | POA: Diagnosis not present

## 2023-12-14 ENCOUNTER — Telehealth: Payer: Self-pay | Admitting: Cardiology

## 2023-12-14 DIAGNOSIS — R002 Palpitations: Secondary | ICD-10-CM

## 2023-12-14 DIAGNOSIS — I10 Essential (primary) hypertension: Secondary | ICD-10-CM

## 2023-12-14 NOTE — Telephone Encounter (Signed)
*  STAT* If patient is at the pharmacy, call can be transferred to refill team.   1. Which medications need to be refilled? (please list name of each medication and dose if known)   carvedilol (COREG) 3.125 MG tablet (Expired)    2. Which pharmacy/location (including street and city if local pharmacy) is medication to be sent to? [facility] [address] [place], [place] - [address] Phone: [phone]  Fax: [phone]   3. Do they need a 30 day or 90 day supply? 90  Pt is not due to comeback to see Dr Odis Hollingshead until end of August

## 2023-12-15 MED ORDER — CARVEDILOL 3.125 MG PO TABS: 3.1250 mg | ORAL_TABLET | Freq: Two times a day (BID) | ORAL | 1 refills | Status: DC

## 2023-12-15 NOTE — Telephone Encounter (Signed)
 Pt's medication was sent to pt's pharmacy as requested. Confirmation received.

## 2024-02-07 DIAGNOSIS — F4320 Adjustment disorder, unspecified: Secondary | ICD-10-CM | POA: Diagnosis not present

## 2024-02-07 DIAGNOSIS — F1020 Alcohol dependence, uncomplicated: Secondary | ICD-10-CM | POA: Diagnosis not present

## 2024-02-07 DIAGNOSIS — L659 Nonscarring hair loss, unspecified: Secondary | ICD-10-CM | POA: Diagnosis not present

## 2024-02-17 DIAGNOSIS — F4322 Adjustment disorder with anxiety: Secondary | ICD-10-CM | POA: Diagnosis not present

## 2024-03-03 ENCOUNTER — Other Ambulatory Visit: Payer: Self-pay | Admitting: Cardiology

## 2024-03-03 DIAGNOSIS — I10 Essential (primary) hypertension: Secondary | ICD-10-CM

## 2024-03-03 DIAGNOSIS — R002 Palpitations: Secondary | ICD-10-CM

## 2024-03-06 DIAGNOSIS — F4320 Adjustment disorder, unspecified: Secondary | ICD-10-CM | POA: Diagnosis not present

## 2024-03-06 DIAGNOSIS — I10 Essential (primary) hypertension: Secondary | ICD-10-CM | POA: Diagnosis not present

## 2024-03-06 DIAGNOSIS — E7800 Pure hypercholesterolemia, unspecified: Secondary | ICD-10-CM | POA: Diagnosis not present

## 2024-03-06 DIAGNOSIS — F1020 Alcohol dependence, uncomplicated: Secondary | ICD-10-CM | POA: Diagnosis not present

## 2024-04-06 DIAGNOSIS — H5203 Hypermetropia, bilateral: Secondary | ICD-10-CM | POA: Diagnosis not present

## 2024-04-10 DIAGNOSIS — I788 Other diseases of capillaries: Secondary | ICD-10-CM | POA: Diagnosis not present

## 2024-04-10 DIAGNOSIS — Z85820 Personal history of malignant melanoma of skin: Secondary | ICD-10-CM | POA: Diagnosis not present

## 2024-04-10 DIAGNOSIS — L570 Actinic keratosis: Secondary | ICD-10-CM | POA: Diagnosis not present

## 2024-04-10 DIAGNOSIS — L820 Inflamed seborrheic keratosis: Secondary | ICD-10-CM | POA: Diagnosis not present

## 2024-04-10 DIAGNOSIS — L821 Other seborrheic keratosis: Secondary | ICD-10-CM | POA: Diagnosis not present

## 2024-04-10 DIAGNOSIS — L814 Other melanin hyperpigmentation: Secondary | ICD-10-CM | POA: Diagnosis not present

## 2024-04-12 DIAGNOSIS — F419 Anxiety disorder, unspecified: Secondary | ICD-10-CM | POA: Diagnosis not present

## 2024-04-12 DIAGNOSIS — M810 Age-related osteoporosis without current pathological fracture: Secondary | ICD-10-CM | POA: Diagnosis not present

## 2024-04-12 DIAGNOSIS — I10 Essential (primary) hypertension: Secondary | ICD-10-CM | POA: Diagnosis not present

## 2024-04-12 DIAGNOSIS — Z1331 Encounter for screening for depression: Secondary | ICD-10-CM | POA: Diagnosis not present

## 2024-04-12 DIAGNOSIS — Z0000 Encounter for general adult medical examination without abnormal findings: Secondary | ICD-10-CM | POA: Diagnosis not present

## 2024-04-12 DIAGNOSIS — F1010 Alcohol abuse, uncomplicated: Secondary | ICD-10-CM | POA: Diagnosis not present

## 2024-04-14 ENCOUNTER — Other Ambulatory Visit: Payer: Self-pay | Admitting: Obstetrics and Gynecology

## 2024-04-14 DIAGNOSIS — Z1231 Encounter for screening mammogram for malignant neoplasm of breast: Secondary | ICD-10-CM

## 2024-04-20 DIAGNOSIS — K080 Exfoliation of teeth due to systemic causes: Secondary | ICD-10-CM | POA: Diagnosis not present

## 2024-05-09 DIAGNOSIS — G4733 Obstructive sleep apnea (adult) (pediatric): Secondary | ICD-10-CM | POA: Diagnosis not present

## 2024-05-22 ENCOUNTER — Other Ambulatory Visit: Payer: Self-pay | Admitting: Medical Genetics

## 2024-05-23 DIAGNOSIS — Z01419 Encounter for gynecological examination (general) (routine) without abnormal findings: Secondary | ICD-10-CM | POA: Diagnosis not present

## 2024-05-23 DIAGNOSIS — Z124 Encounter for screening for malignant neoplasm of cervix: Secondary | ICD-10-CM | POA: Diagnosis not present

## 2024-05-23 DIAGNOSIS — Z6827 Body mass index (BMI) 27.0-27.9, adult: Secondary | ICD-10-CM | POA: Diagnosis not present

## 2024-05-29 ENCOUNTER — Ambulatory Visit
Admission: RE | Admit: 2024-05-29 | Discharge: 2024-05-29 | Disposition: A | Discharge status: Home / Self Care | Source: Ambulatory Visit | Attending: Obstetrics and Gynecology | Admitting: Obstetrics and Gynecology

## 2024-05-29 DIAGNOSIS — Z1231 Encounter for screening mammogram for malignant neoplasm of breast: Secondary | ICD-10-CM

## 2024-06-01 ENCOUNTER — Other Ambulatory Visit

## 2024-06-01 DIAGNOSIS — Z006 Encounter for examination for normal comparison and control in clinical research program: Secondary | ICD-10-CM

## 2024-06-02 ENCOUNTER — Ambulatory Visit: Payer: Medicare Other | Admitting: Cardiology

## 2024-06-09 LAB — GENECONNECT MOLECULAR SCREEN: Genetic Analysis Overall Interpretation: NEGATIVE

## 2024-06-10 ENCOUNTER — Other Ambulatory Visit: Payer: Self-pay | Admitting: Cardiology

## 2024-06-10 DIAGNOSIS — R002 Palpitations: Secondary | ICD-10-CM

## 2024-06-10 DIAGNOSIS — I10 Essential (primary) hypertension: Secondary | ICD-10-CM

## 2024-06-14 ENCOUNTER — Ambulatory Visit: Attending: Cardiology | Admitting: Cardiology

## 2024-06-14 ENCOUNTER — Encounter: Payer: Self-pay | Admitting: Cardiology

## 2024-06-14 VITALS — BP 150/80 | HR 89 | Resp 16 | Ht 65.0 in | Wt 170.8 lb

## 2024-06-14 DIAGNOSIS — R002 Palpitations: Secondary | ICD-10-CM

## 2024-06-14 DIAGNOSIS — I10 Essential (primary) hypertension: Secondary | ICD-10-CM | POA: Diagnosis not present

## 2024-06-14 DIAGNOSIS — G4730 Sleep apnea, unspecified: Secondary | ICD-10-CM | POA: Diagnosis not present

## 2024-06-14 DIAGNOSIS — R0602 Shortness of breath: Secondary | ICD-10-CM

## 2024-06-14 MED ORDER — CARVEDILOL 3.125 MG PO TABS: 3.1250 mg | ORAL_TABLET | Freq: Two times a day (BID) | ORAL | 3 refills | Status: AC

## 2024-06-14 NOTE — Progress Notes (Signed)
 Cardiology Office Note:  .    ID:  Melinda Hodge, DOB July 14, 1956, MRN [id number] PCP:  Cleotilde Planas, MD  Former Cardiology Providers: None [facility] Providers Cardiologist:  Madonna Large, DO , [facility] (established care 07/24/2022) Electrophysiologist:  None  Click to update primary MD,subspecialty MD or APP then REFRESH:1}    Chief Complaint  Patient presents with   Palpitations   Follow-up    1 year    History of Present Illness: .   Melinda Hodge is a 68 y.o. Caucasian female whose past medical history and cardiovascular risk factors includes: Sleep apnea on CPAP, hyperlipidemia, hypertension, migraines, vitamin D deficiency, minimal coronary artery calcification (score 1, 55th percentile, 09/2019), former smoker.   Referred to the practice for evaluation of shortness of breath and palpitations/fatigue.  She underwent appropriate cardiovascular workup including a cardiac monitor.  Zio patch noted tachycardia burden of approximately 13% and echocardiogram noted no significant valvular heart disease.  She was started on low-dose carvedilol  for symptom control and was also referred to sleep medicine for evaluation of sleep apnea.  She was started on CPAP and clinically has done well.  She presents today for 1 year follow-up visit.  She experiences palpitations and shortness of breath during emotional distress. She has been under significant emotional stress due to family events, leading to the initiation of Lexapro in late August. She has been on Effexor for anxiety since age 69.  She walks 20 to 25 minutes daily and aims to increase to 30 minutes.  Review of Systems: .   Review of Systems  Cardiovascular:  Positive for palpitations (chornic and stable). Negative for chest pain, claudication, irregular heartbeat, leg swelling, near-syncope, orthopnea, paroxysmal nocturnal dyspnea and syncope.  Respiratory:  Negative for shortness of breath.   Hematologic/Lymphatic: Negative  for bleeding problem.    Studies Reviewed:   EKG: EKG Interpretation Date/Time:  Wednesday June 14 2024 13:22:47 EDT Ventricular Rate:  83 PR Interval:  154 QRS Duration:  68 QT Interval:  334 QTC Calculation: 392 R Axis:   36  Text Interpretation: Normal sinus rhythm Normal ECG No previous ECGs available Confirmed by Large Madonna [phone]) on 06/14/2024 1:43:57 PM  Echocardiogram: 08/12/2022:  Normal LV systolic function with visual EF 60-65%. Left ventricle cavity is normal in size. Normal left ventricular wall thickness. Normal global wall motion. Doppler evidence of grade I (impaired) diastolic dysfunction, normal LAP. Calculated EF 71%. Structurally normal tricuspid valve. Mild tricuspid regurgitation. No evidence of pulmonary hypertension. No prior available for comparison.   Cardiac monitor: Cardiac monitor (Zio Patch): 07/28/2022 - 08/04/2022 Dominant rhythm sinus, followed by tachycardia (burden 13%). Heart rate 61-152 bpm. Avg HR 90 bpm. No atrial fibrillation, ventricular tachycardia, high grade AV block, pauses (3 seconds or longer). Total ventricular ectopic burden <1%. Total supraventricular ectopic burden <1%. Rare episodes of supraventricular tachycardia. Patient triggered events: 6. Underlying rhythm either sinus or sinus tachycardia with rare PVCs in ventricular trigeminy pattern.   RADIOLOGY: NA  Risk Assessment/Calculations:   NA   Labs:         External Labs: Collected: March 06, 2024 [facility] database. Total cholesterol 148, HDL 55, LDL 64, triglycerides 173  Physical Exam:    Today's Vitals   06/14/24 1320 06/14/24 1354  BP: (!) 157/79 (!) 150/80  Pulse: 89   Resp: 16   SpO2: 96%   Weight: 170 lb 12.8 oz (77.5 kg)   Height: 5' 5 (1.651 m)    Body mass index  is 28.42 kg/m. Wt Readings from Last 3 Encounters:  06/14/24 170 lb 12.8 oz (77.5 kg)  06/14/23 161 lb (73 kg)  12/11/22 157 lb (71.2 kg)    Physical Exam  Constitutional: No  distress.  hemodynamically stable  Neck: No JVD present.  Cardiovascular: Normal rate, regular rhythm, S1 normal and S2 normal. Exam reveals no gallop, no S3 and no S4.  No murmur heard. Pulmonary/Chest: Effort normal and breath sounds normal. No stridor. She has no wheezes. She has no rales.  Musculoskeletal:        General: No edema.     Cervical back: Neck supple.  Skin: Skin is warm.     Impression & Recommendation(s):  Impression:   ICD-10-CM   1. Palpitations  R00.2 EKG 12-Lead    carvedilol  (COREG ) 3.125 MG tablet    2. Benign hypertension  I10 carvedilol  (COREG ) 3.125 MG tablet    3. Sleep apnea in adult  G47.30        Recommendation(s):  Palpitations Prior cardiac monitor noted tachycardia burden approximately 13%. Symptoms are well-controlled on carvedilol . Refill carvedilol  3.125 mg p.o. twice daily. Other precipitating factors include increased stress/emotions and recently was started on Lexapro.  Benign hypertension Office blood pressures are not at goal. Home blood pressures are usually between 120-130 mmHg Refill carvedilol  3.125 mg p.o. twice daily. Reemphasized importance of low-salt diet. Effexor side effect includes hypertension.  If medication is titrated up patient is advised to be more cognizant with her blood pressure readings.  Sleep apnea in adult Endorses compliance with her CPAP  Outside labs from [facility] database independently reviewed as part of today's office visit.   Independently reviewed EKG 06/14/2024   Orders Placed:  Orders Placed This Encounter  Procedures   EKG 12-Lead     Final Medication List:    Meds ordered this encounter  Medications   carvedilol  (COREG ) 3.125 MG tablet    Sig: Take 1 tablet (3.125 mg total) by mouth 2 (two) times daily with a meal.    Dispense:  180 tablet    Refill:  3    Medications Discontinued During This Encounter  Medication Reason   zinc gluconate 50 MG tablet Patient Preference    carvedilol  (COREG ) 3.125 MG tablet Reorder     Current Outpatient Medications:    escitalopram (LEXAPRO) 10 MG tablet, Take 10 mg by mouth daily., Disp: , Rfl:    acyclovir (ZOVIRAX) 400 MG tablet, Take 400 mg by mouth every 8 (eight) hours., Disp: , Rfl:    alendronate (FOSAMAX) 5 MG tablet, Take 5 mg by mouth every 7 (seven) weeks. Take with a full glass of water on an empty stomach., Disp: , Rfl:    ALPRAZolam (XANAX) 0.25 MG tablet, Take 0.25 mg by mouth 2 (two) times daily as needed for anxiety., Disp: , Rfl:    atorvastatin (LIPITOR) 10 MG tablet, Take 10 mg by mouth daily., Disp: , Rfl:    carvedilol  (COREG ) 3.125 MG tablet, Take 1 tablet (3.125 mg total) by mouth 2 (two) times daily with a meal., Disp: 180 tablet, Rfl: 3   cetirizine (ZYRTEC) 10 MG tablet, Take 10 mg by mouth daily., Disp: , Rfl:    Cholecalciferol (VITAMIN D3) 50 MCG (2000 UT) TABS, Take 1 tablet by mouth daily at 12 noon., Disp: , Rfl:    famotidine (PEPCID) 20 MG tablet, Take 20 mg by mouth 2 (two) times daily., Disp: , Rfl:    fluticasone (FLONASE ALLERGY RELIEF) 50 MCG/ACT  nasal spray, Spray 1 spray every day by intranasal route., Disp: , Rfl:    losartan (COZAAR) 50 MG tablet, Take 50 mg by mouth daily., Disp: , Rfl:    progesterone  (PROMETRIUM ) 200 MG capsule, Take 200 mg by mouth daily., Disp: , Rfl:    SUMAtriptan (IMITREX) 100 MG tablet, Take 0.5 tablets by mouth daily as needed., Disp: , Rfl:    venlafaxine XR (EFFEXOR-XR) 75 MG 24 hr capsule, Take 75 mg by mouth daily with breakfast., Disp: , Rfl:    vitamin B-12 (CYANOCOBALAMIN) 500 MCG tablet, Take 500 mcg by mouth daily., Disp: , Rfl:   Consent:   NA  Disposition:   1 year or sooner if needed  Her questions and concerns were addressed to her satisfaction. She voices understanding of the recommendations provided during this encounter.    Signed, Madonna Michele HAS, [facility]  A Division of [facility] [address], [place], [place] [address]

## 2024-06-14 NOTE — Patient Instructions (Signed)
 Medication Instructions:  Continue all medications *If you need a refill on your cardiac medications before your next appointment, please call your pharmacy*  Lab Work: None ordered  Testing/Procedures: None ordered  Follow-Up: At [facility], you and your health needs are our priority.  As part of our continuing mission to provide you with exceptional heart care, our providers are all part of one team.  This team includes your primary Cardiologist (physician) and Advanced Practice Providers or APPs (Physician Assistants and Nurse Practitioners) who all work together to provide you with the care you need, when you need it.  Your next appointment:  1 year   Call in May to schedule Sept appointment     Provider:  Dr.Tolia    We recommend signing up for the patient portal called MyChart.  Sign up information is provided on this After Visit Summary.  MyChart is used to connect with patients for Virtual Visits (Telemedicine).  Patients are able to view lab/test results, encounter notes, upcoming appointments, etc.  Non-urgent messages can be sent to your provider as well.   To learn more about what you can do with MyChart, go to [email or web address].

## 2024-06-24 ENCOUNTER — Encounter: Payer: Self-pay | Admitting: Cardiology

## 2024-07-25 DIAGNOSIS — Z23 Encounter for immunization: Secondary | ICD-10-CM | POA: Diagnosis not present

## 2024-09-11 DIAGNOSIS — L718 Other rosacea: Secondary | ICD-10-CM | POA: Diagnosis not present

## 2024-09-11 DIAGNOSIS — D485 Neoplasm of uncertain behavior of skin: Secondary | ICD-10-CM | POA: Diagnosis not present

## 2024-09-11 DIAGNOSIS — B070 Plantar wart: Secondary | ICD-10-CM | POA: Diagnosis not present

## 2024-09-11 DIAGNOSIS — L570 Actinic keratosis: Secondary | ICD-10-CM | POA: Diagnosis not present

## 2024-09-11 DIAGNOSIS — L720 Epidermal cyst: Secondary | ICD-10-CM | POA: Diagnosis not present

## 2024-09-11 DIAGNOSIS — L820 Inflamed seborrheic keratosis: Secondary | ICD-10-CM | POA: Diagnosis not present

## 2024-09-11 DIAGNOSIS — I788 Other diseases of capillaries: Secondary | ICD-10-CM | POA: Diagnosis not present

## 2024-09-11 DIAGNOSIS — L814 Other melanin hyperpigmentation: Secondary | ICD-10-CM | POA: Diagnosis not present

## 2024-09-24 DIAGNOSIS — N390 Urinary tract infection, site not specified: Secondary | ICD-10-CM | POA: Diagnosis not present

## 2024-09-24 DIAGNOSIS — R509 Fever, unspecified: Secondary | ICD-10-CM | POA: Diagnosis not present

## 2024-09-24 DIAGNOSIS — R399 Unspecified symptoms and signs involving the genitourinary system: Secondary | ICD-10-CM | POA: Diagnosis not present
